# Patient Record
Sex: Female | Born: 1995 | Race: White | Hispanic: No | Smoking: Current every day smoker
Health system: Southern US, Community
[De-identification: ages and names within clinical notes are randomized; demographics above are authoritative.]

## PROBLEM LIST (undated history)

## (undated) ENCOUNTER — Inpatient Hospital Stay: Payer: Self-pay

## (undated) DIAGNOSIS — Z789 Other specified health status: Secondary | ICD-10-CM

## (undated) DIAGNOSIS — D649 Anemia, unspecified: Secondary | ICD-10-CM

## (undated) DIAGNOSIS — R519 Headache, unspecified: Secondary | ICD-10-CM

## (undated) HISTORY — DX: Headache, unspecified: R51.9

## (undated) HISTORY — PX: NO PAST SURGERIES: SHX2092

## (undated) HISTORY — DX: Other specified health status: Z78.9

## (undated) HISTORY — PX: OTHER SURGICAL HISTORY: SHX169

## (undated) HISTORY — DX: Anemia, unspecified: D64.9

---
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
## 2016-01-16 ENCOUNTER — Other Ambulatory Visit: Payer: Self-pay | Admitting: Physician Assistant

## 2016-01-16 DIAGNOSIS — Z369 Encounter for antenatal screening, unspecified: Secondary | ICD-10-CM

## 2016-01-16 LAB — OB RESULTS CONSOLE RPR: RPR: NONREACTIVE

## 2016-01-16 LAB — OB RESULTS CONSOLE HEPATITIS B SURFACE ANTIGEN: HEP B S AG: NEGATIVE

## 2016-01-16 LAB — OB RESULTS CONSOLE VARICELLA ZOSTER ANTIBODY, IGG: Varicella: NON-IMMUNE/NOT IMMUNE

## 2016-01-16 LAB — OB RESULTS CONSOLE RUBELLA ANTIBODY, IGM: Rubella: IMMUNE

## 2016-02-03 ENCOUNTER — Ambulatory Visit
Admission: RE | Admit: 2016-02-03 | Discharge: 2016-02-03 | Disposition: A | Discharge status: Home / Self Care | Payer: Medicaid Other | Source: Ambulatory Visit | Attending: Obstetrics and Gynecology | Admitting: Obstetrics and Gynecology

## 2016-02-03 ENCOUNTER — Ambulatory Visit (HOSPITAL_BASED_OUTPATIENT_CLINIC_OR_DEPARTMENT_OTHER)
Admission: RE | Admit: 2016-02-03 | Discharge: 2016-02-03 | Disposition: A | Discharge status: Home / Self Care | Payer: Medicaid Other | Source: Ambulatory Visit | Attending: Obstetrics and Gynecology | Admitting: Obstetrics and Gynecology

## 2016-02-03 ENCOUNTER — Encounter: Payer: Self-pay | Admitting: *Deleted

## 2016-02-03 VITALS — BP 109/64 | HR 92 | Temp 97.5°F | Resp 17 | Ht 60.0 in | Wt 90.0 lb

## 2016-02-03 DIAGNOSIS — Z369 Encounter for antenatal screening, unspecified: Secondary | ICD-10-CM | POA: Insufficient documentation

## 2016-02-03 DIAGNOSIS — Z363 Encounter for antenatal screening for malformations: Secondary | ICD-10-CM | POA: Insufficient documentation

## 2016-02-03 DIAGNOSIS — Z818 Family history of other mental and behavioral disorders: Secondary | ICD-10-CM | POA: Insufficient documentation

## 2016-02-03 NOTE — Progress Notes (Addendum)
Referring physician:  [facility] Length of Consultation: 40 minutes   Charlene Hood  [facility] for genetic counseling to review prenatal screening and testing options.  This note summarizes the information we discussed.    We offered the following routine screening tests for this pregnancy:  First trimester screening, which includes nuchal translucency ultrasound screen and first trimester maternal serum marker screening.  The nuchal translucency has approximately an 80% detection rate for Down syndrome and can be positive for other chromosome abnormalities as well as congenital heart defects.  When combined with a maternal serum marker screening, the detection rate is up to 90% for Down syndrome and up to 97% for trisomy 18.     Maternal serum marker screening, a blood test that measures pregnancy proteins, can provide risk assessments for Down syndrome, trisomy 18, and open neural tube defects (spina bifida, anencephaly). Because it does not directly examine the fetus, it cannot positively diagnose or rule out these problems.  Targeted ultrasound uses high frequency sound waves to create an image of the developing fetus.  An ultrasound is often recommended as a routine means of evaluating the pregnancy.  It is also used to screen for fetal anatomy problems (for example, a heart defect) that might be suggestive of a chromosomal or other abnormality.   Should these screening tests indicate an increased concern, then the following additional testing options would be offered:  The chorionic villus sampling procedure is available for first trimester chromosome analysis.  This involves the withdrawal of a small amount of chorionic villi (tissue from the developing placenta).  Risk of pregnancy loss is estimated to be approximately 1 in 200 to 1 in 100 (0.5 to 1%).  There is approximately a 1% (1 in 100) chance that the CVS chromosome results will  be unclear.  Chorionic villi cannot be tested for neural tube defects.     Amniocentesis involves the removal of a small amount of amniotic fluid from the sac surrounding the fetus with the use of a thin needle inserted through the maternal abdomen and uterus.  Ultrasound guidance is used throughout the procedure.  Fetal cells from amniotic fluid are directly evaluated and > 99.5% of chromosome problems and > 98% of open neural tube defects can be detected. This procedure is generally performed after the 15th week of pregnancy.  The main risks to this procedure include complications leading to miscarriage in less than 1 in 200 cases (0.5%).  As another option for information if the pregnancy is suspected to be an an increased chance for certain chromosome conditions, we also reviewed the availability of cell free fetal DNA testing from maternal blood to determine whether or not the baby may have either Down syndrome, trisomy 813, or trisomy 7718.  This test utilizes a maternal blood sample and DNA sequencing technology to isolate circulating cell free fetal DNA from maternal plasma.  The fetal DNA can then be analyzed for DNA sequences that are derived from the three most common chromosomes involved in aneuploidy, chromosomes 13, 18, and 21.  If the overall amount of DNA is greater than the expected level for any of these chromosomes, aneuploidy is suspected.  While we do not consider it a replacement for invasive testing and karyotype analysis, a negative result from this testing would be reassuring, though not a guarantee of a normal chromosome complement for the baby.  An abnormal result is certainly suggestive of an abnormal chromosome complement,  though we would still recommend CVS or amniocentesis to confirm any findings from this testing.  Cystic Fibrosis and Spinal Muscular Atrophy (SMA) screening were also discussed with the patient. Both conditions are recessive, which means that both parents must be  carriers in order to have a child with the disease.  Cystic fibrosis (CF) is one of the most common genetic conditions in persons of Caucasian ancestry.  This condition occurs in approximately 1 in 2,500 Caucasian persons and results in thickened secretions in the lungs, digestive, and reproductive systems.  For a baby to be at risk for having CF, both of the parents must be carriers for this condition.  Approximately 1 in 4625 Caucasian persons is a carrier for CF.  Current carrier testing looks for the most common mutations in the gene for CF and can detect approximately 90% of carriers in the Caucasian population.  This means that the carrier screening can greatly reduce, but cannot eliminate, the chance for an individual to have a child with CF.  If an individual is found to be a carrier for CF, then carrier testing would be available for the partner. As part of Charlene Hood's newborn screening profile, all babies born in the state of [place] will have a two-tier screening process.  Specimens are first tested to determine the concentration of immunoreactive trypsinogen (IRT).  The top 5% of specimens with the highest IRT values then undergo DNA testing using a panel of over 40 common CF mutations. SMA is a neurodegenerative disorder that leads to atrophy of skeletal muscle and overall weakness.  This condition is also more prevalent in the Caucasian population, with 1 in 40-1 in 60 persons being a carrier and 1 in 6,000-1 in 10,000 children being affected.  There are multiple forms of the disease, with some causing death in infancy to other forms with survival into adulthood.  The genetics of SMA is complex, but carrier screening can detect up to 95% of carriers in the Caucasian population.  Similar to CF, a negative result can greatly reduce, but cannot eliminate, the chance to have a child with SMA.  We obtained a detailed family history and pregnancy history.  Ms. Charlene Hood reported one maternal first  cousin with autism.  The family feels that this may have been related to substance abuse in pregnancy, but the patient is not aware of a specific diagnosis or if any genetic testing has been performed.  While autism may be a feature of certain genetic conditions, including Fragile X syndrome, there are also many cases of autism that are not well understood.  We are happy to review medical records on his testing to help provide a more accurate risk assessment if desired.  We offered Fragile X carrier testing for our patient today.  She also described one maternal aunt with brain damage at birth, who has two sons with mild learning differences.  Because there are so many reasons for developmental differences, more specific information is needed to accurately assess recurrence risks for other family members.  Lastly, the patient has a maternal first cousin who has two children with sickle cell trait.  The patient and her family are Caucasian, but the father of the children with sickle cell trait is African American.  We reviewed that the most likely scenario is that those children inherited that genetic change from their father.  However, it is possible that the sickle cell trait came from our patient's family.  For this reason, we offered hemoglobinopathy  testing for the patient.  We also discussed that all newborn babies in [place] are testing for hemoglobinopathies at birth.  The remainder of the family history was reported to be unremarkable for birth defects, mental retardation, recurrent pregnancy loss or known chromosome abnormalities.  Charlene Hood stated that this is her first pregnancy.  She reported no complications or exposures other than cigarette smoking during this pregnancy.  She reported smoking 3-4 cigarettes per day, down from 8-9 per day.  As we discussed, smoking during pregnancy has been associated with low birth weight, premature delivery and pregnancy loss.  For this reason, we suggest that she  continue to cut back or avoid smoking for the remainder of the pregnancy.  After consideration of the options, Charlene Hood elected to proceed with first trimester screening.  She declined carrier screening for CF, SMA, Fragile X and hemoglobinopathies.  Though she wanted first trimester screening, she left without having her blood drawn for this testing.  We spoke with her by phone and explained that if she wanted the screening, she had to return to our clinic today for labs (this testing cannot be performed after 13 weeks, 6 days).  She declined the option of returning today.  She is aware that maternal serum quad screening can be performed at [facility] in the second trimester if she desires risk assessment for Down syndrome, trisomy 73 and spina bifida.  An ultrasound was performed at the time of the visit.  The gestational age was consistent with 13 weeks, 5 days.  Fetal anatomy could not be assessed due to early gestational age.  Please refer to the ultrasound report for details of that study.  Charlene Hood was encouraged to call with questions or concerns.  We can be contacted at [phone].    Cherly Anderson, MS, CGC

## 2016-02-03 NOTE — Progress Notes (Signed)
Charlene Wells, MS, CGC performed an integral service incident to the physician's initial service.  I was physically present in the clinical area and was immediately available to render assistance.   [name]  

## 2016-02-17 NOTE — L&D Delivery Note (Signed)
       Delivery Note   Charlene Hood is a 21 y.o. G1P1001 at 6039w4d Estimated Date of Delivery: 08/05/16  PRE-OPERATIVE DIAGNOSIS:  1) 1739w4d pregnancy.   POST-OPERATIVE DIAGNOSIS:  1) 6839w4d pregnancy s/p Vaginal, Spontaneous Delivery   Delivery Type: Vaginal, Spontaneous Delivery    Delivery Clinician: Linzie CollinEVANS, Kalin Amrhein JAMES   Delivery Anesthesia: Epidural   ESTIMATED BLOOD LOSS: 150  ml    FINDINGS:   1) female infant, Apgar scores of 6    at 1 minute and 9    at 5 minutes and a birthweight of 115.7  ounces.    2) Nuchal cord: yes  SPECIMENS:   PLACENTA:   Appearance: Intact    Removal: Spontaneous      Disposition:     DISPOSITION:  Infant to left in stable condition in the delivery room, with L&D personnel and mother,  NARRATIVE SUMMARY: Labor course:  Ms. Charlene Hood is a G1P1001 at 9639w4d who presented for labor management.  She progressed well in labor without pitocin.  She received the appropriate anesthesia and proceeded to complete dilation. She evidenced good maternal expulsive effort during the second stage. She went on to deliver a viable infant. The placenta delivered without problems and was noted to be complete. A perineal and vaginal examination was performed. Episiotomy/Lacerations: 1st degree;Labial  Episiotomy or lacerations were repaired with Vicryl suture. The patient tolerated this well.  Elonda Huskyavid J. Anel Purohit, M.D. 07/26/2016 11:23 PM

## 2016-02-18 ENCOUNTER — Emergency Department
Admission: EM | Admit: 2016-02-18 | Discharge: 2016-02-18 | Disposition: A | Discharge status: Home / Self Care | Payer: Medicaid Other | Attending: Emergency Medicine | Admitting: Emergency Medicine

## 2016-02-18 ENCOUNTER — Encounter: Payer: Self-pay | Admitting: Emergency Medicine

## 2016-02-18 DIAGNOSIS — F17210 Nicotine dependence, cigarettes, uncomplicated: Secondary | ICD-10-CM | POA: Insufficient documentation

## 2016-02-18 DIAGNOSIS — Z3A16 16 weeks gestation of pregnancy: Secondary | ICD-10-CM | POA: Insufficient documentation

## 2016-02-18 DIAGNOSIS — O219 Vomiting of pregnancy, unspecified: Secondary | ICD-10-CM | POA: Diagnosis present

## 2016-02-18 DIAGNOSIS — R112 Nausea with vomiting, unspecified: Secondary | ICD-10-CM

## 2016-02-18 DIAGNOSIS — O99332 Smoking (tobacco) complicating pregnancy, second trimester: Secondary | ICD-10-CM | POA: Diagnosis not present

## 2016-02-18 LAB — URINALYSIS, ROUTINE W REFLEX MICROSCOPIC
Bacteria, UA: NONE SEEN
Bilirubin Urine: NEGATIVE
GLUCOSE, UA: NEGATIVE mg/dL
Hgb urine dipstick: NEGATIVE
KETONES UR: NEGATIVE mg/dL
NITRITE: NEGATIVE
PROTEIN: NEGATIVE mg/dL
Specific Gravity, Urine: 1.019 (ref 1.005–1.030)
pH: 6 (ref 5.0–8.0)

## 2016-02-18 LAB — COMPREHENSIVE METABOLIC PANEL
ALBUMIN: 3.8 g/dL (ref 3.5–5.0)
ALT: 17 U/L (ref 14–54)
AST: 30 U/L (ref 15–41)
Alkaline Phosphatase: 50 U/L (ref 38–126)
Anion gap: 7 (ref 5–15)
BUN: 13 mg/dL (ref 6–20)
CHLORIDE: 106 mmol/L (ref 101–111)
CO2: 25 mmol/L (ref 22–32)
CREATININE: 0.58 mg/dL (ref 0.44–1.00)
Calcium: 8.8 mg/dL — ABNORMAL LOW (ref 8.9–10.3)
GFR calc non Af Amer: >60 mL/min (ref >60)
GLUCOSE: 120 mg/dL — AB (ref 65–99)
Potassium: 3.6 mmol/L (ref 3.5–5.1)
SODIUM: 138 mmol/L (ref 135–145)
Total Bilirubin: 0.9 mg/dL (ref 0.3–1.2)
Total Protein: 7 g/dL (ref 6.5–8.1)

## 2016-02-18 LAB — CBC
HCT: 35 % (ref 35.0–47.0)
Hemoglobin: 12.1 g/dL (ref 12.0–16.0)
MCH: 31.5 pg (ref 26.0–34.0)
MCHC: 34.6 g/dL (ref 32.0–36.0)
MCV: 91.2 fL (ref 80.0–100.0)
PLATELETS: 245 10*3/uL (ref 150–440)
RBC: 3.83 MIL/uL (ref 3.80–5.20)
RDW: 13 % (ref 11.5–14.5)
WBC: 17 10*3/uL — AB (ref 3.6–11.0)

## 2016-02-18 LAB — LIPASE, BLOOD: Lipase: 24 U/L (ref 11–51)

## 2016-02-18 MED ORDER — ONDANSETRON HCL 4 MG PO TABS: 4.0000 mg | ORAL_TABLET | Freq: Every day | ORAL | 0 refills | Status: DC | PRN

## 2016-02-18 MED ORDER — ONDANSETRON HCL 4 MG/2ML IJ SOLN
INTRAMUSCULAR | Status: AC
  Administered 2016-02-18: 4 mg via INTRAVENOUS
  Filled 2016-02-18: qty 2

## 2016-02-18 MED ORDER — ONDANSETRON HCL 4 MG/2ML IJ SOLN
4.0000 mg | Freq: Once | INTRAMUSCULAR | Status: AC
  Administered 2016-02-18: 4 mg via INTRAVENOUS

## 2016-02-18 MED ORDER — SODIUM CHLORIDE 0.9 % IV BOLUS (SEPSIS)
1000.0000 mL | Freq: Once | INTRAVENOUS | Status: AC
  Administered 2016-02-18: 1000 mL via INTRAVENOUS

## 2016-02-18 NOTE — ED Triage Notes (Signed)
Pt c/o nausea and vomiting that started this am, denies fevers. Pt A&Ox4, NAD

## 2016-02-18 NOTE — ED Provider Notes (Signed)
[facility] Emergency Department Provider Note  Time seen: 5:42 PM  I have reviewed the triage vital signs and the nursing notes.   HISTORY  Chief Complaint Abdominal Pain    HPI Charlene Hood is a 21 y.o. female Approximately [redacted] weeks pregnant who presents to the emergency department with nausea and vomiting. According to the patient since this morning she has been very nauseated with vomiting. States she has had one loose bowel movement. States mild upper abdominal discomfort. Denies any lower abdominal discomfort. Denies any vaginal bleeding or discharge denies any dysuria or hematuria.  History reviewed. No pertinent past medical history.  Patient Active Problem List   Diagnosis Date Noted  . Family history of autism   . First trimester screening     History reviewed. No pertinent surgical history.  Prior to Admission medications   Medication Sig Start Date End Date Taking? Authorizing Provider  Prenatal Vit-Fe Fumarate-FA (MULTIVITAMIN-PRENATAL) 27-0.8 MG TABS tablet Take 1 tablet by mouth daily at 12 noon.    Historical Provider, MD    No Known Allergies  No family history on file.  Social History Social History  Substance Use Topics  . Smoking status: Current Every Day Smoker  . Smokeless tobacco: Never Used     Comment: 3-4 cigarettes per day  . Alcohol use No    Review of Systems Constitutional: Negative for fever. Cardiovascular: Negative for chest pain. Respiratory: Negative for shortness of breath. Gastrointestinal: mild upper abdominal discomfort. Positive for nausea and vomiting. One episode of diarrhea. Genitourinary: Negative for dysuria. negative for vaginal bleeding or discharge. Neurological: Negative for headache 10-point ROS otherwise negative.  ____________________________________________   PHYSICAL EXAM:  VITAL SIGNS: ED Triage Vitals  Enc Vitals Group     BP 02/18/16 1454 (!) 121/59     Pulse Rate 02/18/16  1454 (!) 103     Resp 02/18/16 1454 18     Temp 02/18/16 1454 98.2 F (36.8 C)     Temp Source 02/18/16 1454 Oral     SpO2 02/18/16 1454 98 %     Weight 02/18/16 1454 91 lb (41.3 kg)     Height 02/18/16 1454 5' (1.524 m)     Head Circumference --      Peak Flow --      Pain Score 02/18/16 1502 5     Pain Loc --      Pain Edu? --      Excl. in GC? --     Constitutional: Alert and oriented. Well appearing and in no distress. Eyes: Normal exam ENT   Head: Normocephalic and atraumatic.   Mouth/Throat: Mucous membranes are moist. Cardiovascular: Normal rate, regular rhythm. No murmur Respiratory: Normal respiratory effort without tachypnea nor retractions. Breath sounds are clear Gastrointestinal: Soft and nontender. No distention.  no abdominal tenderness on exam. Musculoskeletal: Nontender with normal range of motion in all extremities.  Neurologic:  Normal speech and language. No gross focal neurologic deficits  Skin:  Skin is warm, dry and intact.  Psychiatric: Mood and affect are normal.   ____________________________________________    INITIAL IMPRESSION / ASSESSMENT AND PLAN / ED COURSE  Pertinent labs & imaging results that were available during my care of the patient were reviewed by me and considered in my medical decision making (see chart for details).  patient presents to the emergency department with upper abdominal discomfort and nausea and vomiting beginning this morning. No abdominal tenderness. Patient is [redacted] weeks pregnant, followed by  the health Department.  the patient had an ultrasound performed in December. We will check labs, treat nausea, IV hydrate and closely monitor in the emergency department. Patient denies any history of morning sickness throughout this pregnancy thus far.  patient's labs show white blood cell count of 17,000, chemistry is largely within normal limits. Highly suspect likely gastroenteritis. We will continue to IV hydrate and  monitor closely. Overall the patient appears well, nontoxic, no distress.  Patient states he feels much better. Has tolerated date and drink without issue. We will discharge home with a course of Zofran. I discussed cleft lip and cleft palate risk with the patient. She will follow up with OB/GYN. ____________________________________________   FINAL CLINICAL IMPRESSION(S) / ED DIAGNOSES  vomiting    Minna AntisKevin Kayzlee Wirtanen, MD 02/18/16 1845

## 2016-02-18 NOTE — ED Notes (Signed)
Pt given dinner tray.  Pt ate and tolerated food well.

## 2016-03-09 ENCOUNTER — Ambulatory Visit
Admission: RE | Admit: 2016-03-09 | Discharge: 2016-03-09 | Disposition: A | Discharge status: Home / Self Care | Payer: Medicaid Other | Source: Ambulatory Visit | Attending: Obstetrics and Gynecology | Admitting: Obstetrics and Gynecology

## 2016-03-09 ENCOUNTER — Other Ambulatory Visit: Payer: Self-pay | Admitting: *Deleted

## 2016-03-09 DIAGNOSIS — Z362 Encounter for other antenatal screening follow-up: Secondary | ICD-10-CM | POA: Insufficient documentation

## 2016-03-09 DIAGNOSIS — Z3A18 18 weeks gestation of pregnancy: Secondary | ICD-10-CM | POA: Insufficient documentation

## 2016-03-09 DIAGNOSIS — Z0489 Encounter for examination and observation for other specified reasons: Secondary | ICD-10-CM

## 2016-03-09 DIAGNOSIS — IMO0002 Reserved for concepts with insufficient information to code with codable children: Secondary | ICD-10-CM

## 2016-05-07 LAB — OB RESULTS CONSOLE HIV ANTIBODY (ROUTINE TESTING): HIV: NONREACTIVE

## 2016-05-08 LAB — OB RESULTS CONSOLE RPR: RPR: NONREACTIVE

## 2016-07-03 ENCOUNTER — Observation Stay
Admission: EM | Admit: 2016-07-03 | Discharge: 2016-07-03 | Disposition: A | Discharge status: Home / Self Care | Payer: Medicaid Other | Attending: Obstetrics and Gynecology | Admitting: Obstetrics and Gynecology

## 2016-07-03 ENCOUNTER — Encounter: Payer: Self-pay | Admitting: *Deleted

## 2016-07-03 DIAGNOSIS — R102 Pelvic and perineal pain: Secondary | ICD-10-CM | POA: Insufficient documentation

## 2016-07-03 DIAGNOSIS — O26893 Other specified pregnancy related conditions, third trimester: Principal | ICD-10-CM | POA: Insufficient documentation

## 2016-07-03 DIAGNOSIS — Z3A35 35 weeks gestation of pregnancy: Secondary | ICD-10-CM | POA: Insufficient documentation

## 2016-07-03 DIAGNOSIS — R109 Unspecified abdominal pain: Secondary | ICD-10-CM | POA: Diagnosis not present

## 2016-07-03 DIAGNOSIS — Z3490 Encounter for supervision of normal pregnancy, unspecified, unspecified trimester: Secondary | ICD-10-CM

## 2016-07-03 NOTE — Discharge Instructions (Signed)
Stay well hydrated and get plenty of rest! ° °Come back if: ° °Big gush of fluid °Decreased fetal movement °Heavy vaginal bleeding °Temp over 100.4 °Contractions every 3-5 min lasting at least one hour °

## 2016-07-03 NOTE — Progress Notes (Signed)
Spoke with Dr. Logan BoresEvans. Explained she was having light menstrual cramps that she has not felt since being here. Was told to reassure pt and drink more water. May discharge pt.

## 2016-07-03 NOTE — OB Triage Note (Signed)
Recvd pt from ED. C/O abdominal pain that feels like light menstrual cramps and sharp vaginal pain that comes and goes. No intercourse in the past 24 hours and not a lot to drink today. No vaginal bleeding and feeling baby move ok. Rates pain a 7 our of 10.

## 2016-07-04 NOTE — Discharge Summary (Signed)
    L&D OB Triage Note  SUBJECTIVE Charlene Hood is a 21 y.o. G1P0 female at 6443w3d, EDD Estimated Date of Delivery: 08/05/16 who presented to triage with complaints of abdominal pain that feels like light menstrual cramps and sharp vaginal pain that comes and goes.  No vaginal bleeding.  Baby active.  Obstetric History   G1   P0   T0   P0   A0   L0    SAB0   TAB0   Ectopic0   Multiple0   Live Births0     # Outcome Date GA Lbr Len/2nd Weight Sex Delivery Anes PTL Lv  1 Current               No prescriptions prior to admission.     OBJECTIVE  Nursing Evaluation:   BP 125/75   Pulse 81   Temp 98.4 F (36.9 C) (Oral)   Resp 16   LMP 11/09/2015    Findings:  Rare mild contractions  NST was performed and has been reviewed by me.  NST INTERPRETATION: Category I  Mode: External Baseline Rate (A): 135 bpm Variability: Moderate Accelerations: 10 x 10, 15 x 15 Decelerations: None     Contraction Frequency (min): irritability with few contractions  ASSESSMENT Impression:  1. Pregnancy:  G1P0 at 6743w3d , EDD Estimated Date of Delivery: 08/05/16 2. Not in labor.   3. Fetal reassurance.  PLAN 1. Reassurance given 2. Discharge home with precautions to return to L&D  3. Continue routine prenatal care.

## 2016-07-11 LAB — OB RESULTS CONSOLE GBS: GBS: NEGATIVE

## 2016-07-11 LAB — OB RESULTS CONSOLE GC/CHLAMYDIA
CHLAMYDIA, DNA PROBE: NEGATIVE
GC PROBE AMP, GENITAL: NEGATIVE

## 2016-07-26 ENCOUNTER — Encounter: Payer: Self-pay | Admitting: *Deleted

## 2016-07-26 ENCOUNTER — Inpatient Hospital Stay: Payer: Medicaid Other | Admitting: Anesthesiology

## 2016-07-26 ENCOUNTER — Inpatient Hospital Stay
Admission: EM | Admit: 2016-07-26 | Discharge: 2016-07-28 | DRG: 775 | Disposition: A | Discharge status: Home / Self Care | Payer: Medicaid Other | Attending: Obstetrics and Gynecology | Admitting: Obstetrics and Gynecology

## 2016-07-26 DIAGNOSIS — Z3493 Encounter for supervision of normal pregnancy, unspecified, third trimester: Secondary | ICD-10-CM | POA: Diagnosis present

## 2016-07-26 DIAGNOSIS — Z3A38 38 weeks gestation of pregnancy: Secondary | ICD-10-CM

## 2016-07-26 LAB — CBC
HEMATOCRIT: 37.7 % (ref 35.0–47.0)
HEMOGLOBIN: 13.4 g/dL (ref 12.0–16.0)
MCH: 31.2 pg (ref 26.0–34.0)
MCHC: 35.6 g/dL (ref 32.0–36.0)
MCV: 87.7 fL (ref 80.0–100.0)
Platelets: 266 10*3/uL (ref 150–440)
RBC: 4.3 MIL/uL (ref 3.80–5.20)
RDW: 13.1 % (ref 11.5–14.5)
WBC: 19.2 10*3/uL — ABNORMAL HIGH (ref 3.6–11.0)

## 2016-07-26 LAB — TYPE AND SCREEN
ABO/RH(D): A POS
ANTIBODY SCREEN: NEGATIVE

## 2016-07-26 LAB — URINE DRUG SCREEN, QUALITATIVE (ARMC ONLY)
Amphetamines, Ur Screen: NOT DETECTED
BARBITURATES, UR SCREEN: NOT DETECTED
Benzodiazepine, Ur Scrn: NOT DETECTED
Cannabinoid 50 Ng, Ur ~~LOC~~: NOT DETECTED
Cocaine Metabolite,Ur ~~LOC~~: NOT DETECTED
MDMA (Ecstasy)Ur Screen: NOT DETECTED
METHADONE SCREEN, URINE: NOT DETECTED
Opiate, Ur Screen: NOT DETECTED
Phencyclidine (PCP) Ur S: NOT DETECTED
Tricyclic, Ur Screen: NOT DETECTED

## 2016-07-26 MED ORDER — MEPERIDINE HCL 25 MG/ML IJ SOLN: 6.2500 mg | INTRAMUSCULAR | Status: DC | PRN

## 2016-07-26 MED ORDER — FENTANYL 2.5 MCG/ML W/ROPIVACAINE 0.2% IN NS 100 ML EPIDURAL INFUSION (ARMC-ANES)
EPIDURAL | Status: DC | PRN
  Administered 2016-07-26: 10 mL/h via EPIDURAL

## 2016-07-26 MED ORDER — OXYTOCIN BOLUS FROM INFUSION
500.0000 mL | Freq: Once | INTRAVENOUS | Status: AC
  Administered 2016-07-26: 500 mL via INTRAVENOUS

## 2016-07-26 MED ORDER — FENTANYL 2.5 MCG/ML W/ROPIVACAINE 0.2% IN NS 100 ML EPIDURAL INFUSION (ARMC-ANES): 10.0000 mL/h | EPIDURAL | Status: DC

## 2016-07-26 MED ORDER — FENTANYL 2.5 MCG/ML W/ROPIVACAINE 0.2% IN NS 100 ML EPIDURAL INFUSION (ARMC-ANES)
EPIDURAL | Status: AC
  Filled 2016-07-26: qty 100

## 2016-07-26 MED ORDER — LIDOCAINE-EPINEPHRINE (PF) 1.5 %-1:200000 IJ SOLN
INTRAMUSCULAR | Status: DC | PRN
  Administered 2016-07-26: 3 mL via EPIDURAL

## 2016-07-26 MED ORDER — MISOPROSTOL 200 MCG PO TABS
ORAL_TABLET | ORAL | Status: AC
  Filled 2016-07-26: qty 4

## 2016-07-26 MED ORDER — ACETAMINOPHEN 325 MG PO TABS: 650.0000 mg | ORAL_TABLET | ORAL | Status: DC | PRN

## 2016-07-26 MED ORDER — KETOROLAC TROMETHAMINE 30 MG/ML IJ SOLN: 30.0000 mg | Freq: Four times a day (QID) | INTRAMUSCULAR | Status: AC | PRN

## 2016-07-26 MED ORDER — DIPHENHYDRAMINE HCL 50 MG/ML IJ SOLN: 12.5000 mg | INTRAMUSCULAR | Status: DC | PRN

## 2016-07-26 MED ORDER — NALBUPHINE HCL 10 MG/ML IJ SOLN
5.0000 mg | Freq: Once | INTRAMUSCULAR | Status: DC | PRN
Start: 2016-07-26 — End: 2016-07-29

## 2016-07-26 MED ORDER — ONDANSETRON HCL 4 MG/2ML IJ SOLN: 4.0000 mg | Freq: Three times a day (TID) | INTRAMUSCULAR | Status: DC | PRN

## 2016-07-26 MED ORDER — LIDOCAINE HCL (PF) 1 % IJ SOLN
INTRAMUSCULAR | Status: DC | PRN
  Administered 2016-07-26: 3 mL via SUBCUTANEOUS

## 2016-07-26 MED ORDER — NALOXONE HCL 0.4 MG/ML IJ SOLN: 0.4000 mg | INTRAMUSCULAR | Status: DC | PRN

## 2016-07-26 MED ORDER — NALBUPHINE HCL 10 MG/ML IJ SOLN: 5.0000 mg | INTRAMUSCULAR | Status: DC | PRN

## 2016-07-26 MED ORDER — SODIUM CHLORIDE 0.9% FLUSH: 3.0000 mL | INTRAVENOUS | Status: DC | PRN

## 2016-07-26 MED ORDER — AMMONIA AROMATIC IN INHA
RESPIRATORY_TRACT | Status: AC
  Filled 2016-07-26: qty 10

## 2016-07-26 MED ORDER — LACTATED RINGERS IV SOLN
INTRAVENOUS | Status: DC
  Administered 2016-07-26 (×2): via INTRAVENOUS

## 2016-07-26 MED ORDER — BUTORPHANOL TARTRATE 2 MG/ML IJ SOLN
INTRAMUSCULAR | Status: AC
  Administered 2016-07-26: 1 mg via INTRAVENOUS
  Filled 2016-07-26: qty 2

## 2016-07-26 MED ORDER — SOD CITRATE-CITRIC ACID 500-334 MG/5ML PO SOLN
30.0000 mL | ORAL | Status: DC | PRN
  Filled 2016-07-26: qty 30

## 2016-07-26 MED ORDER — BUTORPHANOL TARTRATE 1 MG/ML IJ SOLN
1.0000 mg | INTRAMUSCULAR | Status: DC | PRN
  Administered 2016-07-26: 1 mg via INTRAVENOUS

## 2016-07-26 MED ORDER — OXYTOCIN 10 UNIT/ML IJ SOLN
INTRAMUSCULAR | Status: AC
  Filled 2016-07-26: qty 2

## 2016-07-26 MED ORDER — SODIUM CHLORIDE 0.9 % IV SOLN
INTRAVENOUS | Status: DC | PRN
  Administered 2016-07-26 (×2): 4 mL via EPIDURAL

## 2016-07-26 MED ORDER — NALBUPHINE HCL 10 MG/ML IJ SOLN: 5.0000 mg | Freq: Once | INTRAMUSCULAR | Status: DC | PRN

## 2016-07-26 MED ORDER — LIDOCAINE HCL (PF) 1 % IJ SOLN
INTRAMUSCULAR | Status: AC
  Filled 2016-07-26: qty 30

## 2016-07-26 MED ORDER — OXYTOCIN 40 UNITS IN LACTATED RINGERS INFUSION - SIMPLE MED: 2.5000 [IU]/h | INTRAVENOUS | Status: DC

## 2016-07-26 MED ORDER — NALOXONE HCL 2 MG/2ML IJ SOSY
1.0000 ug/kg/h | PREFILLED_SYRINGE | INTRAVENOUS | Status: DC | PRN
  Filled 2016-07-26: qty 2

## 2016-07-26 MED ORDER — DIPHENHYDRAMINE HCL 25 MG PO CAPS: 25.0000 mg | ORAL_CAPSULE | ORAL | Status: DC | PRN

## 2016-07-26 MED ORDER — PNEUMOCOCCAL VAC POLYVALENT 25 MCG/0.5ML IJ INJ
0.5000 mL | INJECTION | INTRAMUSCULAR | Status: DC
  Filled 2016-07-26: qty 0.5

## 2016-07-26 MED ORDER — LIDOCAINE HCL (PF) 1 % IJ SOLN: 30.0000 mL | INTRAMUSCULAR | Status: DC | PRN

## 2016-07-26 MED ORDER — IBUPROFEN 600 MG PO TABS
600.0000 mg | ORAL_TABLET | Freq: Four times a day (QID) | ORAL | Status: DC
  Administered 2016-07-27 – 2016-07-28 (×7): 600 mg via ORAL
  Filled 2016-07-26 (×7): qty 1

## 2016-07-26 MED ORDER — OXYTOCIN 40 UNITS IN LACTATED RINGERS INFUSION - SIMPLE MED
INTRAVENOUS | Status: AC
  Administered 2016-07-26: 500 mL via INTRAVENOUS
  Filled 2016-07-26: qty 1000

## 2016-07-26 MED ORDER — LACTATED RINGERS IV SOLN
500.0000 mL | INTRAVENOUS | Status: DC | PRN
  Administered 2016-07-26: 1000 mL via INTRAVENOUS

## 2016-07-26 MED ORDER — ONDANSETRON HCL 4 MG/2ML IJ SOLN: 4.0000 mg | Freq: Four times a day (QID) | INTRAMUSCULAR | Status: DC | PRN

## 2016-07-26 NOTE — Anesthesia Preprocedure Evaluation (Signed)
Anesthesia Evaluation  Patient identified by MRN, date of birth, ID band Patient awake    Reviewed: Allergy & Precautions, H&P , NPO status , Patient's Chart, lab work & pertinent test results, reviewed documented beta blocker date and time   History of Anesthesia Complications Negative for: history of anesthetic complications  Airway Mallampati: III  TM Distance: >3 FB Neck ROM: full    Dental  (+) Caps, Teeth Intact   Pulmonary neg shortness of breath, Recent URI , Current Smoker,           Cardiovascular Exercise Tolerance: Good negative cardio ROS       Neuro/Psych negative neurological ROS  negative psych ROS   GI/Hepatic negative GI ROS, Neg liver ROS,   Endo/Other  negative endocrine ROS  Renal/GU negative Renal ROS  negative genitourinary   Musculoskeletal   Abdominal   Peds  Hematology negative hematology ROS (+)   Anesthesia Other Findings History reviewed. No pertinent past medical history.   Reproductive/Obstetrics (+) Pregnancy                             Anesthesia Physical Anesthesia Plan  ASA: II  Anesthesia Plan: Epidural   Post-op Pain Management:    Induction:   PONV Risk Score and Plan:   Airway Management Planned:   Additional Equipment:   Intra-op Plan:   Post-operative Plan:   Informed Consent: I have reviewed the patients History and Physical, chart, labs and discussed the procedure including the risks, benefits and alternatives for the proposed anesthesia with the patient or authorized representative who has indicated his/her understanding and acceptance.   Dental Advisory Given  Plan Discussed with: Anesthesiologist, CRNA and Surgeon  Anesthesia Plan Comments:         Anesthesia Quick Evaluation

## 2016-07-26 NOTE — H&P (Signed)
     History and Physical   HPI  Charlene Hood is a 21 y.o. G1P0 at 2778w4d Estimated Date of Delivery: 08/05/16 who is being admitted for  labor management [facility]  OB History  Obstetric History   G1   P0   T0   P0   A0   L0    SAB0   TAB0   Ectopic0   Multiple0   Live Births0     # Outcome Date GA Lbr Len/2nd Weight Sex Delivery Anes PTL Lv  1 Current               PROBLEM LIST  Pregnancy complications or risks: Patient Active Problem List   Diagnosis Date Noted  . Pregnancy 07/03/2016  . Family history of autism   . First trimester screening     Prenatal labs and studies: ABO, Rh:  A + Antibody:  N Rubella:   RPR:   N HBsAg:  N  HIV:  N  GBS: Negative  History reviewed. No pertinent past medical history.   Past Surgical History:  Procedure Laterality Date  . NO PAST SURGERIES       Medications    Current Discharge Medication List    CONTINUE these medications which have NOT CHANGED   Details  acetaminophen (TYLENOL) 500 MG tablet Take 500 mg by mouth every 6 (six) hours as needed.    ferrous sulfate 325 (65 FE) MG tablet Take 325 mg by mouth daily with breakfast.    Prenatal Vit-Fe Fumarate-FA (MULTIVITAMIN-PRENATAL) 27-0.8 MG TABS tablet Take 1 tablet by mouth daily at 12 noon.    ondansetron (ZOFRAN) 4 MG tablet Take 1 tablet (4 mg total) by mouth daily as needed for nausea or vomiting. Qty: 20 tablet, Refills: 0         Allergies  Patient has no known allergies.  Review of Systems  Pertinent items noted in HPI and remainder of comprehensive ROS otherwise negative.  Physical Exam  BP 129/87 (BP Location: Left Arm)   Pulse 74   Temp 98.3 F (36.8 C) (Oral)   Resp 16   Ht 5' (1.524 m)   Wt 108 lb (49 kg)   LMP 11/09/2015   BMI 21.09 kg/m   Lungs:  CTA B Cardio: RRR without M/R/G Abd: Soft, gravid, NT Presentation: cephalic EXT: No C/C/ 1+ Edema DTRs: 2+ B CERVIX: 3 cm:75%: -3: mid position:  soft  See Prenatal records for more detailed PE.     FHR:  Variability: Good {> 6 bpm)  Toco: Uterine Contractions: Frequency: Every 3 minutes and Intensity: moderate   Test Results  No results found for this or any previous visit (from the past 24 hour(s)). Group B Strep negative  Assessment   G1P0 at 178w4d Estimated Date of Delivery: 08/05/16  The fetus is reassuring.   Patient Active Problem List   Diagnosis Date Noted  . Pregnancy 07/03/2016  . Family history of autism   . First trimester screening     Plan  1. Admit to L&D for expectant management 2. EFM: -- Category 1 3. Epidural if desired. Stadol for IV pain until epidural requested. 4. Admission labs    Elonda Huskyavid J. Fannie Alomar, M.D. 07/26/2016 12:26 PM

## 2016-07-26 NOTE — OB Triage Note (Signed)
Ctx started @ 0500 this am worsening @ 0900. Cervix 1 cm as of Friday, per pt. Elaina HoopsElks, Acel Natzke S

## 2016-07-26 NOTE — Anesthesia Procedure Notes (Signed)
Epidural Patient location during procedure: OB Start time: 07/26/2016 2:03 PM End time: 07/26/2016 2:12 PM  Staffing Anesthesiologist: Lenard SimmerKARENZ, Irena Gaydos Performed: anesthesiologist   Preanesthetic Checklist Completed: patient identified, site marked, surgical consent, pre-op evaluation, timeout performed, IV checked, risks and benefits discussed and monitors and equipment checked  Epidural Patient position: sitting Prep: ChloraPrep Patient monitoring: heart rate, continuous pulse ox and blood pressure Approach: midline Location: L3-L4 Injection technique: LOR saline  Needle:  Needle type: Tuohy  Needle gauge: 17 G Needle length: 9 cm and 9 Needle insertion depth: 3.5 cm Catheter type: closed end flexible Catheter size: 19 Gauge Catheter at skin depth: 8 cm Test dose: negative and 1.5% lidocaine with Epi 1:200 K  Assessment Sensory level: T10 Events: blood not aspirated, injection not painful, no injection resistance, negative IV test and no paresthesia  Additional Notes Pt. Evaluated and documentation done after procedure finished. Patient identified. Risks/Benefits/Options discussed with patient including but not limited to bleeding, infection, nerve damage, paralysis, failed block, incomplete pain control, headache, blood pressure changes, nausea, vomiting, reactions to medication both or allergic, itching and postpartum back pain. Confirmed with bedside nurse the patient's most recent platelet count. Confirmed with patient that they are not currently taking any anticoagulation, have any bleeding history or any family history of bleeding disorders. Patient expressed understanding and wished to proceed. All questions were answered. Sterile technique was used throughout the entire procedure. Please see nursing notes for vital signs. Test dose was given through epidural catheter and negative prior to continuing to dose epidural or start infusion. Warning signs of high block given to the  patient including shortness of breath, tingling/numbness in hands, complete motor block, or any concerning symptoms with instructions to call for help. Patient was given instructions on fall risk and not to get out of bed. All questions and concerns addressed with instructions to call with any issues or inadequate analgesia.   Patient tolerated the insertion well without immediate complications.Reason for block:procedure for pain

## 2016-07-27 DIAGNOSIS — Z3A38 38 weeks gestation of pregnancy: Secondary | ICD-10-CM

## 2016-07-27 LAB — CHLAMYDIA/NGC RT PCR (ARMC ONLY)
Chlamydia Tr: NOT DETECTED
N GONORRHOEAE: NOT DETECTED

## 2016-07-27 LAB — RPR: RPR Ser Ql: NONREACTIVE

## 2016-07-27 MED ORDER — BENZOCAINE-MENTHOL 20-0.5 % EX AERO
1.0000 "application " | INHALATION_SPRAY | CUTANEOUS | Status: DC | PRN
  Filled 2016-07-27: qty 56

## 2016-07-27 MED ORDER — DIPHENHYDRAMINE HCL 25 MG PO CAPS: 25.0000 mg | ORAL_CAPSULE | Freq: Four times a day (QID) | ORAL | Status: DC | PRN

## 2016-07-27 MED ORDER — ACETAMINOPHEN 325 MG PO TABS: 650.0000 mg | ORAL_TABLET | ORAL | Status: DC | PRN

## 2016-07-27 MED ORDER — SIMETHICONE 80 MG PO CHEW: 80.0000 mg | CHEWABLE_TABLET | ORAL | Status: DC | PRN

## 2016-07-27 MED ORDER — TETANUS-DIPHTH-ACELL PERTUSSIS 5-2.5-18.5 LF-MCG/0.5 IM SUSP: 0.5000 mL | Freq: Once | INTRAMUSCULAR | Status: DC

## 2016-07-27 MED ORDER — DOCUSATE SODIUM 100 MG PO CAPS
100.0000 mg | ORAL_CAPSULE | Freq: Two times a day (BID) | ORAL | Status: DC
  Administered 2016-07-27 – 2016-07-28 (×3): 100 mg via ORAL
  Filled 2016-07-27 (×3): qty 1

## 2016-07-27 MED ORDER — ONDANSETRON HCL 4 MG/2ML IJ SOLN: 4.0000 mg | INTRAMUSCULAR | Status: DC | PRN

## 2016-07-27 MED ORDER — ONDANSETRON HCL 4 MG PO TABS: 4.0000 mg | ORAL_TABLET | ORAL | Status: DC | PRN

## 2016-07-27 MED ORDER — OXYCODONE-ACETAMINOPHEN 5-325 MG PO TABS: 2.0000 | ORAL_TABLET | ORAL | Status: DC | PRN

## 2016-07-27 MED ORDER — PRENATAL MULTIVITAMIN CH
1.0000 | ORAL_TABLET | Freq: Every day | ORAL | Status: DC
  Administered 2016-07-27 – 2016-07-28 (×2): 1 via ORAL
  Filled 2016-07-27 (×2): qty 1

## 2016-07-27 MED ORDER — OXYCODONE-ACETAMINOPHEN 5-325 MG PO TABS
1.0000 | ORAL_TABLET | ORAL | Status: DC | PRN
  Administered 2016-07-27 – 2016-07-28 (×2): 1 via ORAL
  Filled 2016-07-27 (×2): qty 1

## 2016-07-27 MED ORDER — DIBUCAINE 1 % RE OINT: 1.0000 "application " | TOPICAL_OINTMENT | RECTAL | Status: DC | PRN

## 2016-07-27 MED ORDER — OXYTOCIN 40 UNITS IN LACTATED RINGERS INFUSION - SIMPLE MED
2.5000 [IU]/h | INTRAVENOUS | Status: DC | PRN
  Filled 2016-07-27: qty 1000

## 2016-07-27 MED ORDER — COCONUT OIL OIL
1.0000 "application " | TOPICAL_OIL | Status: DC | PRN
  Administered 2016-07-27: 1 via TOPICAL
  Filled 2016-07-27: qty 120

## 2016-07-27 MED ORDER — ZOLPIDEM TARTRATE 5 MG PO TABS: 5.0000 mg | ORAL_TABLET | Freq: Every evening | ORAL | Status: DC | PRN

## 2016-07-27 MED ORDER — WITCH HAZEL-GLYCERIN EX PADS: 1.0000 "application " | MEDICATED_PAD | CUTANEOUS | Status: DC | PRN

## 2016-07-27 NOTE — Anesthesia Postprocedure Evaluation (Signed)
Anesthesia Post Note  Patient: Charlene Hood  Procedure(s) Performed: * No procedures listed *  Patient location during evaluation: Mother Baby Anesthesia Type: Epidural Level of consciousness: awake, awake and alert and oriented Pain management: pain level controlled Vital Signs Assessment: post-procedure vital signs reviewed and stable Respiratory status: spontaneous breathing Cardiovascular status: blood pressure returned to baseline Postop Assessment: no headache, no backache, no signs of nausea or vomiting and adequate PO intake Anesthetic complications: no     Last Vitals:  Vitals:   07/27/16 0429 07/27/16 0440  BP: 120/66   Pulse: 64   Resp: 13 18  Temp: 37.1 C     Last Pain:  Vitals:   07/27/16 0429  TempSrc: Oral  PainSc:                  Karoline Caldwelleana Adaira Centola

## 2016-07-27 NOTE — Progress Notes (Signed)
Patient ID: Charlene Hood, female   DOB: 1995/04/26, 21 y.o.   MRN: [id number]  Progress Note - Vaginal Delivery  Charlene Bladeicole D Mcevoy is a 21 y.o. G1P1001 now PP day 1 s/p Vaginal, Spontaneous Delivery .   Subjective:  The patient reports no complaints  Objective:  Vital signs in last 24 hours: Temp:  [98.1 F (36.7 C)-99.8 F (37.7 C)] 98.1 F (36.7 C) (06/11 1059) Pulse Rate:  [61-104] 61 (06/11 1059) Resp:  [13-20] 18 (06/11 1059) BP: (110-149)/(61-95) 117/61 (06/11 1059) SpO2:  [97 %-100 %] 97 % (06/11 1059)  Physical Exam:  General: alert, cooperative and no distress Lochia: appropriate Uterine Fundus: firm DVT Evaluation: No evidence of DVT seen on physical exam.    Data Review  Recent Labs  07/26/16 1229  HGB 13.4  HCT 37.7    Assessment/Plan: Active Problems:   Normal labor   Plan for discharge tomorrow and Breastfeeding without problem  -- Continue routine PP care.     Elonda Huskyavid J. Evans, M.D. 07/27/2016 3:23 PM

## 2016-07-28 MED ORDER — VARICELLA VIRUS VACCINE LIVE 1350 PFU/0.5ML ~~LOC~~ INJ
0.5000 mL | INJECTION | Freq: Once | SUBCUTANEOUS | Status: AC
Start: 2016-07-28 — End: 2016-07-28
  Administered 2016-07-28: 0.5 mL via SUBCUTANEOUS
  Filled 2016-07-28: qty 0.5
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
## 2016-07-28 NOTE — Discharge Instructions (Signed)
Discharged home with infant

## 2016-07-28 NOTE — Discharge Summary (Signed)
                              Discharge Summary  Date of Admission: 07/26/2016  Date of Discharge: 07/28/2016  Admitting Diagnosis: Onset of Labor at 5776w4d  Secondary Diagnosis:   Mode of Delivery: normal spontaneous vaginal delivery     Discharge Diagnosis: Normal PP   Intrapartum Procedures: Atificial rupture of membranes, epidural and placement of intrauterine catheter   Post partum procedures:   Complications: none                      Discharge Day SOAP Note:  Progress Note - Vaginal Delivery  Charlene Hood is a 21 y.o. G1P1001 now PP day 2 s/p Vaginal, Spontaneous Delivery . Delivery was uncomplicated  Subjective  The patient has the following complaints: has no unusual complaints  Pain is controlled with current medications.   Patient is urinating without difficulty.  She is ambulating well.    Objective  Vital signs: BP 124/84 (BP Location: Right Arm)   Pulse 81   Temp 98 F (36.7 C) (Oral)   Resp 16   Ht 5' (1.524 m)   Wt 108 lb (49 kg)   LMP 11/09/2015   SpO2 98%   Breastfeeding? Unknown   BMI 21.09 kg/m   Physical Exam: Gen: NAD Fundus Fundal Tone: Firm  Lochia Amount: Small  Perineum Appearance: Edematous, Approximated     Data Review Labs: CBC Latest Ref Rng & Units 07/26/2016 02/18/2016  WBC 3.6 - 11.0 K/uL 19.2(H) 17.0(H)  Hemoglobin 12.0 - 16.0 g/dL 16.113.4 09.612.1  Hematocrit 04.535.0 - 47.0 % 37.7 35.0  Platelets 150 - 440 K/uL 266 245   A POS  Assessment/Plan  Active Problems:   Normal labor    Plan for discharge today.   Discharge Instructions: Per After Visit Summary. Activity: Advance as tolerated. Pelvic rest for 6 weeks.  Also refer to After Visit Summary Diet: Regular Medications: Allergies as of 07/28/2016   No Known Allergies     Medication List    STOP taking these medications   ferrous sulfate 325 (65 FE) MG tablet   multivitamin-prenatal 27-0.8 MG Tabs tablet   ondansetron 4 MG tablet Commonly known as:   ZOFRAN     TAKE these medications   acetaminophen 500 MG tablet Commonly known as:  TYLENOL Take 500 mg by mouth every 6 (six) hours as needed.      Outpatient follow up:  Follow-up Information    Linzie CollinEvans, Marinell Igarashi James, MD Follow up in 6 week(s).   Specialty:  Obstetrics and Gynecology Contact information: [address] [place] [address] [phone]          Postpartum contraception: discuss at 6 wk check  Discharged Condition: good  Discharged to: home  Newborn Data: Disposition:baby under bili lights today - possible d/c at 1600  Apgars: APGAR (1 MIN): 6   APGAR (5 MINS): 9   APGAR (10 MINS):    Baby Feeding: Breast    Elonda Huskyavid J. Hiawatha Merriott, M.D. 07/28/2016 8:25 AM

## 2016-07-28 NOTE — Progress Notes (Signed)
Discharged home with baby. Infant placed in carseat by parent

## 2016-07-28 NOTE — Care Management Note (Signed)
Case Management Note  Patient Details  Name: Charlene Hood MRN: [id number] Date of Birth: 02/21/1995  Subjective/Objective:     Wants more information about medicaid. Mother has maternity medicaid               Action/Plan: Discussed going or calling the Department of Social Services to start the application process for new baby Greig Castillandrew.   Expected Discharge Date:  07/28/16               Expected Discharge Plan:     In-House Referral:   yes  Discharge planning Services   yes  Post Acute Care Choice:   no Choice offered to:     DME Arranged:   no DME Agency:     HH Arranged:   no HH Agency:     Status of Service:   in progress  If discussed at Long Length of Stay Meetings, dates discussed:    Additional Comments:  Gwenette GreetBrenda S Ajahni Nay, RN MSN CCM Care Management [phone] 07/28/2016, 9:39 AM

## 2017-10-29 DIAGNOSIS — Z30013 Encounter for initial prescription of injectable contraceptive: Secondary | ICD-10-CM | POA: Diagnosis not present

## 2017-10-29 DIAGNOSIS — Z0000 Encounter for general adult medical examination without abnormal findings: Secondary | ICD-10-CM | POA: Diagnosis not present

## 2017-10-29 DIAGNOSIS — Z3009 Encounter for other general counseling and advice on contraception: Secondary | ICD-10-CM | POA: Diagnosis not present

## 2018-02-25 DIAGNOSIS — Z30013 Encounter for initial prescription of injectable contraceptive: Secondary | ICD-10-CM | POA: Diagnosis not present

## 2018-02-25 DIAGNOSIS — Z309 Encounter for contraceptive management, unspecified: Secondary | ICD-10-CM | POA: Diagnosis not present

## 2019-04-24 DIAGNOSIS — H5203 Hypermetropia, bilateral: Secondary | ICD-10-CM | POA: Diagnosis not present

## 2019-04-24 DIAGNOSIS — H52223 Regular astigmatism, bilateral: Secondary | ICD-10-CM | POA: Diagnosis not present

## 2019-04-24 DIAGNOSIS — H53023 Refractive amblyopia, bilateral: Secondary | ICD-10-CM | POA: Diagnosis not present

## 2019-04-25 DIAGNOSIS — H5213 Myopia, bilateral: Secondary | ICD-10-CM | POA: Diagnosis not present

## 2019-05-30 DIAGNOSIS — H5203 Hypermetropia, bilateral: Secondary | ICD-10-CM | POA: Diagnosis not present

## 2021-02-16 NOTE — L&D Delivery Note (Signed)
Delivery Note Charlene Hood was being admitted  from the triage room to a labor room when she reported s strong need to push. She was moved rapidly to LDR 4 and assisted into a birthing bed where she delivered rapidly.At 1753 a viable female was delivered vaginally over an intact perineum.  Loose nuchal cord was reduced on the perineum.The Nursery team was present with NNP in attendance (Presentation: LOA to ROP     ).  APGAR: 8,9 ; weight pending .   Placenta status:  ,intact, delivered at 1759  .  Cord:  3 vessel  with the following complications: none.    Anesthesia:  none Episiotomy:  none Lacerations:  none Suture Repair:  NA Est. Blood Loss (mL):  50 cc  Mom to postpartum.  Baby to [facility] care / [facility]  Mirna Mires 10/13/2021, 6:54 PM

## 2021-04-07 ENCOUNTER — Other Ambulatory Visit: Payer: Self-pay

## 2021-04-07 ENCOUNTER — Ambulatory Visit (LOCAL_COMMUNITY_HEALTH_CENTER): Payer: Medicaid Other

## 2021-04-07 VITALS — BP 114/72 | Ht 60.0 in | Wt 120.0 lb

## 2021-04-07 DIAGNOSIS — Z3201 Encounter for pregnancy test, result positive: Secondary | ICD-10-CM

## 2021-04-07 LAB — PREGNANCY, URINE: Preg Test, Ur: POSITIVE — AB

## 2021-04-07 MED ORDER — PRENATAL 27-0.8 MG PO TABS: 1.0000 | ORAL_TABLET | Freq: Every day | ORAL | 0 refills | Status: AC

## 2021-04-07 NOTE — Progress Notes (Signed)
UPT positive. Plans prenatal care at [facility]. To DSS for medicaid/preg women. Josie Saunders, RN

## 2021-05-06 ENCOUNTER — Telehealth: Payer: Self-pay | Admitting: Family Medicine

## 2021-05-06 NOTE — Telephone Encounter (Signed)
Pt already called to reschedule NEW OB appt ?

## 2021-05-15 ENCOUNTER — Ambulatory Visit: Payer: Medicaid Other | Admitting: Advanced Practice Midwife

## 2021-05-15 ENCOUNTER — Encounter: Payer: Self-pay | Admitting: Advanced Practice Midwife

## 2021-05-15 ENCOUNTER — Other Ambulatory Visit: Payer: Self-pay

## 2021-05-15 DIAGNOSIS — Z3482 Encounter for supervision of other normal pregnancy, second trimester: Secondary | ICD-10-CM | POA: Diagnosis not present

## 2021-05-15 DIAGNOSIS — O0930 Supervision of pregnancy with insufficient antenatal care, unspecified trimester: Secondary | ICD-10-CM

## 2021-05-15 DIAGNOSIS — F17200 Nicotine dependence, unspecified, uncomplicated: Secondary | ICD-10-CM

## 2021-05-15 DIAGNOSIS — Z818 Family history of other mental and behavioral disorders: Secondary | ICD-10-CM

## 2021-05-15 LAB — HEMOGLOBIN, FINGERSTICK: Hemoglobin: 12.2 g/dL (ref 11.1–15.9)

## 2021-05-15 LAB — URINALYSIS
Bilirubin, UA: NEGATIVE
Glucose, UA: NEGATIVE
Ketones, UA: NEGATIVE
Leukocytes,UA: NEGATIVE
Nitrite, UA: NEGATIVE
Protein,UA: NEGATIVE
RBC, UA: NEGATIVE
Specific Gravity, UA: 1.02 (ref 1.005–1.030)
Urobilinogen, Ur: 0.2 mg/dL (ref 0.2–1.0)
pH, UA: 6.5 (ref 5.0–7.5)

## 2021-05-15 LAB — WET PREP FOR TRICH, YEAST, CLUE
Trichomonas Exam: NEGATIVE
Yeast Exam: NEGATIVE

## 2021-05-15 NOTE — Progress Notes (Addendum)
Presents for initiation of prenatal care. Per client, no medical care thus far in pregnancy. Reports LNMP 01/18/2022 with 2-3 days of scant bleeding beginning 02/10/2022. Jossie Ng, RN ?Wet prep, urine dip and hgb reviewed - no interventions required per standing order.[facility] OB US scheduled for 05/27/2021 with arrival time of 1315 with a full bladder. Instructions provided to client with verbalized understanding. [facility] and Parking Map provided to client wit hwritten [place] appt date / time. Jossie Ng, RN ? ? ?

## 2021-05-15 NOTE — Progress Notes (Signed)
[facility] Department  ?Maternal Health Clinic ? ? ?INITIAL PRENATAL VISIT NOTE ? ?Subjective:  ?Charlene Hood is a 26 y.o.  SWF smoker G2P1001 (39 yo son) at [redacted]w[redacted]d being seen today to start prenatal care at the [facility] Department. She feels "good" about surprise pregnancy with no birth control. 26 yo unemployed FOB feels "happy" about pregnancy and is the father of her son; in supportive 6 year relationship. LNMP 01/18/21. LMP abnormally light 02/10/21. Denies u/s or ER use this pregnancy. Can't remember last pap. Smoking 6-7 cpd, last MJ 6 years ago, last ETOH 02/2021 (7 shots liquor) 1-2x/wk.  Denies vaping, cigars. She is currently monitored for the following issues for this low-risk pregnancy and has Family history of autism m.1st cousin; Late prenatal care 16 5/7; Prenatal care, subsequent pregnancy, second trimester; and Smoker 6-7 cpd on their problem list. ? ?Patient reports no complaints.  Contractions: Not present. Vag. Bleeding: None.  Movement: Absent. Denies leaking of fluid.  ? ?Indications for ASA therapy (per uptodate) ?One of the following: ?Previous pregnancy with preeclampsia, especially early onset and with an adverse outcome No ?Multifetal gestation No ?Chronic hypertension No ?Type 1 or 2 diabetes mellitus No ?Chronic kidney disease No ?Autoimmune disease (antiphospholipid syndrome, systemic lupus erythematosus) No ? ?Two or more of the following: ?Nulliparity No ?Obesity (body mass index >30 kg/m2) No ?Family history of preeclampsia in mother or sister No ?Age ?35 years No ?Sociodemographic characteristics (African American race, low socioeconomic level) No ?Personal risk factors (eg, previous pregnancy with low birth weight or small for gestational age infant, previous adverse pregnancy outcome [eg, stillbirth], interval >10 years between pregnancies) No ? ? ?The following portions of the patient's history were reviewed and updated as appropriate: allergies, current  medications, past family history, past medical history, past social history, past surgical history and problem list. Problem list updated. ? ?Objective:  ? ?Vitals:  ? 05/15/21 1357  ?BP: 114/68  ?Pulse: 91  ?Temp: (!) 97.4 ?F (36.3 ?C)  ?Weight: 119 lb 3.2 oz (54.1 kg)  ? ? ?Fetal Status: Fetal Heart Rate (bpm): 160 Fundal Height: 14 cm Movement: Absent  Presentation: Undeterminable ? ? ?Physical Exam ?Vitals and nursing note reviewed.  ?Constitutional:   ?   General: She is not in acute distress. ?   Appearance: Normal appearance. She is well-developed and normal weight.  ?HENT:  ?   Head: Normocephalic and atraumatic.  ?   Right Ear: External ear normal.  ?   Left Ear: External ear normal.  ?   Nose: Nose normal. No congestion or rhinorrhea.  ?   Mouth/Throat:  ?   Lips: Pink.  ?   Mouth: Mucous membranes are moist.  ?   Dentition: Normal dentition. No dental caries.  ?   Pharynx: Oropharynx is clear. Uvula midline.  ?   Comments: Dentition: poor with caries ?Last dental exam 2012 ?Eyes:  ?   General: No scleral icterus. ?   Conjunctiva/sclera: Conjunctivae normal.  ?Neck:  ?   Thyroid: No thyroid mass, thyromegaly or thyroid tenderness.  ?Cardiovascular:  ?   Rate and Rhythm: Normal rate.  ?   Pulses: Normal pulses.  ?   Comments: Extremities are warm and well perfused ?Pulmonary:  ?   Effort: Pulmonary effort is normal.  ?   Breath sounds: Normal breath sounds.  ?Chest:  ?   Chest wall: No mass.  ?Breasts: ?   Tanner Score is 5.  ?   Breasts  are symmetrical.  ?   Right: Normal. No mass, nipple discharge or skin change.  ?   Left: Normal. No mass, nipple discharge or skin change.  ?Abdominal:  ?   Palpations: Abdomen is soft.  ?   Tenderness: There is no abdominal tenderness.  ?   Comments: Gravid, good tone, soft without masses or tenderness, FHR=160, FH 14-16 wks  ?Genitourinary: ?   General: Normal vulva.  ?   Exam position: Lithotomy position.  ?   Pubic Area: No rash.   ?   Labia:     ?   Right: No rash.      ?   Left: No rash.   ?   Vagina: Vaginal discharge (white creamy leukorrhea, ph< 4.5) present.  ?   Cervix: Normal.  ?   Uterus: Normal. Enlarged (Gravid 14-16 wks size). Not tender.   ?   Adnexa: Right adnexa normal and left adnexa normal.  ?   Rectum: Normal. No external hemorrhoid.  ?Musculoskeletal:  ?   Right lower leg: No edema.  ?   Left lower leg: No edema.  ?Lymphadenopathy:  ?   Cervical: No cervical adenopathy.  ?   Upper Body:  ?   Right upper body: No axillary adenopathy.  ?   Left upper body: No axillary adenopathy.  ?Skin: ?   General: Skin is warm.  ?   Capillary Refill: Capillary refill takes less than 2 seconds.  ?Neurological:  ?   Mental Status: She is alert.  ? ? ?Assessment and Plan:  ?Pregnancy: G2P1001 at [redacted]w[redacted]d ? ?1. Late prenatal care 16 5/7 ? ? ?2. Prenatal care, subsequent pregnancy, second trimester ?Counseled on weight gain of 25-35 lbs this pregnancy ?Declines genetic counseling and genetic screening ?Please give dental list to pt ?U/s ordered for dating ? ?- Pap IG (Image Guided) ?- [id number] Drug Screen ?- Urine Culture & Sensitivity ?- WET PREP FOR TRICH, YEAST, CLUE ?- Urinalysis (Urine Dip) ?- Hemoglobin, venipuncture ?- Chlamydia/GC NAA, Confirmation ?- Lead, blood (adult age 26 yrs or greater) ?- HIV-1/HIV-2 Qualitative RNA ?- HCV Ab w Reflex to Quant PCR ?- Prenatal profile without Varicella or Rubella ? ?3. Smoker 6-7 cpd ?Counseled via 5 A's to stop smoking ? ?4. Family history of autism m.1st cousin ?Declines genetic counseling and screening ? ? ? ?Discussed overview of care and coordination with inpatient delivery practices including [facility], [facility], [facility] and [facility].  ? ?Reviewed Centering pregnancy as standard of care at [facility] ? ? ?Preterm labor symptoms and general obstetric precautions including but not limited to vaginal bleeding, contractions, leaking of fluid and fetal movement were reviewed in detail with the patient. ? ?Please refer to After Visit  Summary for other counseling recommendations.  ? ?Return in about 4 weeks (around 06/12/2021) for routine PNC. ? ?No future appointments. ? ?Herbie Saxon, CNM ? ?

## 2021-05-16 LAB — CBC/D/PLT+RPR+RH+ABO+AB SCR
Antibody Screen: NEGATIVE
Basophils Absolute: 0 10*3/uL (ref 0.0–0.2)
Basos: 0 %
EOS (ABSOLUTE): 0.1 10*3/uL (ref 0.0–0.4)
Eos: 1 %
Hematocrit: 35.3 % (ref 34.0–46.6)
Hemoglobin: 12.3 g/dL (ref 11.1–15.9)
Hepatitis B Surface Ag: NEGATIVE
Immature Grans (Abs): 0 10*3/uL (ref 0.0–0.1)
Immature Granulocytes: 0 %
Lymphocytes Absolute: 1.6 10*3/uL (ref 0.7–3.1)
Lymphs: 14 %
MCH: 31.9 pg (ref 26.6–33.0)
MCHC: 34.8 g/dL (ref 31.5–35.7)
MCV: 92 fL (ref 79–97)
Monocytes Absolute: 0.4 10*3/uL (ref 0.1–0.9)
Monocytes: 4 %
Neutrophils Absolute: 9.1 10*3/uL — ABNORMAL HIGH (ref 1.4–7.0)
Neutrophils: 81 %
Platelets: 257 10*3/uL (ref 150–450)
RBC: 3.85 x10E6/uL (ref 3.77–5.28)
RDW: 11.5 % — ABNORMAL LOW (ref 11.7–15.4)
RPR Ser Ql: NONREACTIVE
Rh Factor: POSITIVE
WBC: 11.3 10*3/uL — ABNORMAL HIGH (ref 3.4–10.8)

## 2021-05-16 LAB — HCV AB W REFLEX TO QUANT PCR: HCV Ab: NONREACTIVE

## 2021-05-16 LAB — HCV INTERPRETATION

## 2021-05-17 LAB — 789231 7+OXYCODONE-BUND
Amphetamines, Urine: NEGATIVE ng/mL
BENZODIAZ UR QL: NEGATIVE ng/mL
Barbiturate screen, urine: NEGATIVE ng/mL
Cannabinoid Quant, Ur: NEGATIVE ng/mL
Cocaine (Metab.): NEGATIVE ng/mL
OPIATE SCREEN URINE: NEGATIVE ng/mL
Oxycodone/Oxymorphone, Urine: NEGATIVE ng/mL
PCP Quant, Ur: NEGATIVE ng/mL

## 2021-05-17 LAB — LEAD, BLOOD (ADULT >= 16 YRS): Lead-Whole Blood: <1 ug/dL (ref 0.0–3.4)

## 2021-05-17 LAB — CHLAMYDIA/GC NAA, CONFIRMATION
Chlamydia trachomatis, NAA: NEGATIVE
Neisseria gonorrhoeae, NAA: NEGATIVE

## 2021-05-17 LAB — HIV-1/HIV-2 QUALITATIVE RNA
HIV-1 RNA, Qualitative: NONREACTIVE
HIV-2 RNA, Qualitative: NONREACTIVE

## 2021-05-18 LAB — URINE CULTURE

## 2021-05-20 LAB — PAP IG (IMAGE GUIDED): PAP Smear Comment: 0

## 2021-05-21 ENCOUNTER — Telehealth: Payer: Self-pay

## 2021-05-21 NOTE — Telephone Encounter (Signed)
Call to client to notify her [facility] had to reschedule her 05/27/2021 [place] to 06/02/2021 with arrival time of 1245. [place] [facility] and to arrive with a full bladder. Call to client with above information and agreeable with plan. Jossie Ng, RN ? ?

## 2021-05-27 ENCOUNTER — Ambulatory Visit: Admission: RE | Admit: 2021-05-27 | Payer: Medicaid Other | Source: Ambulatory Visit

## 2021-06-02 ENCOUNTER — Ambulatory Visit
Admission: RE | Admit: 2021-06-02 | Discharge: 2021-06-02 | Disposition: A | Discharge status: Home / Self Care | Payer: Medicaid Other | Source: Ambulatory Visit | Attending: Advanced Practice Midwife | Admitting: Advanced Practice Midwife

## 2021-06-02 DIAGNOSIS — Z3A15 15 weeks gestation of pregnancy: Secondary | ICD-10-CM | POA: Diagnosis not present

## 2021-06-02 DIAGNOSIS — Z3482 Encounter for supervision of other normal pregnancy, second trimester: Secondary | ICD-10-CM

## 2021-06-02 DIAGNOSIS — Z3492 Encounter for supervision of normal pregnancy, unspecified, second trimester: Secondary | ICD-10-CM | POA: Diagnosis not present

## 2021-06-04 ENCOUNTER — Encounter: Payer: Self-pay | Admitting: Advanced Practice Midwife

## 2021-06-04 DIAGNOSIS — Z3482 Encounter for supervision of other normal pregnancy, second trimester: Secondary | ICD-10-CM

## 2021-06-05 ENCOUNTER — Encounter: Payer: Self-pay | Admitting: Advanced Practice Midwife

## 2021-06-11 ENCOUNTER — Encounter: Payer: Self-pay | Admitting: Advanced Practice Midwife

## 2021-06-12 ENCOUNTER — Telehealth: Payer: Self-pay

## 2021-06-12 ENCOUNTER — Ambulatory Visit: Payer: Medicaid Other

## 2021-06-12 NOTE — Telephone Encounter (Signed)
Per Epic appt desk, client has now rescheduled [facility] appt for 06/18/2021. Jossie Ng, RN ? ?

## 2021-06-12 NOTE — Telephone Encounter (Signed)
Client [facility] for [facility] RV on 06/12/2021 as scheduled. Call to client and per recorded message, voicemail box is not set up. Call to emergency contact (brother) and left voicemail requesting assistance contacting client to have her reschedule missed [facility] appt. Number to call provided. Note added to pink sticky to request client set up voicemail. Jossie Ng, RN ? ?

## 2021-06-16 ENCOUNTER — Encounter: Payer: Self-pay | Admitting: Advanced Practice Midwife

## 2021-06-18 ENCOUNTER — Ambulatory Visit: Payer: Medicaid Other

## 2021-06-26 ENCOUNTER — Telehealth: Payer: Self-pay

## 2021-06-26 ENCOUNTER — Ambulatory Visit: Payer: Medicaid Other | Admitting: Advanced Practice Midwife

## 2021-06-26 VITALS — BP 105/63 | HR 87 | Temp 98.3°F | Wt 122.2 lb

## 2021-06-26 DIAGNOSIS — O0930 Supervision of pregnancy with insufficient antenatal care, unspecified trimester: Secondary | ICD-10-CM

## 2021-06-26 DIAGNOSIS — O0932 Supervision of pregnancy with insufficient antenatal care, second trimester: Secondary | ICD-10-CM

## 2021-06-26 DIAGNOSIS — Z818 Family history of other mental and behavioral disorders: Secondary | ICD-10-CM

## 2021-06-26 DIAGNOSIS — F17200 Nicotine dependence, unspecified, uncomplicated: Secondary | ICD-10-CM

## 2021-06-26 DIAGNOSIS — Z3482 Encounter for supervision of other normal pregnancy, second trimester: Secondary | ICD-10-CM

## 2021-06-26 NOTE — Progress Notes (Signed)
Requested she set up voicemail on her cell phone. Undecided as to postpartum BCM - verified has Animal nutritionist. Kept 06/02/2021 [place] appt. Rich Number, RN ? ? ?

## 2021-06-26 NOTE — Telephone Encounter (Signed)
[facility] [place] follow-up ordered electronically as RN inadvertently told provider prior US was done at  ?

## 2021-06-26 NOTE — Telephone Encounter (Signed)
Provider requested to re-order [place] as not done at [facility] as told to her by RN. [place] was completed at [facility]. Rich Number, RN ? ?

## 2021-06-26 NOTE — Progress Notes (Signed)
[facility] Department ?Maternal Health Clinic ? ?PRENATAL VISIT NOTE ? ?Subjective:  ?Charlene Hood is a 26 y.o. G2P1001 at [redacted]w[redacted]d being seen today for ongoing prenatal care.  She is currently monitored for the following issues for this low-risk pregnancy and has Family history of autism m.1st cousin; Late prenatal care 16 5/7; Prenatal care, subsequent pregnancy, second trimester; and Smoker 6-7 cpd on their problem list. ? ?Patient reports no complaints.  Contractions: Not present. Vag. Bleeding: None.  Movement: Present. Denies leaking of fluid/ROM.  ? ?The following portions of the patient's history were reviewed and updated as appropriate: allergies, current medications, past family history, past medical history, past social history, past surgical history and problem list. Problem list updated. ? ?Objective:  ? ?Vitals:  ? 06/26/21 1435  ?BP: 105/63  ?Pulse: 87  ?Temp: 98.3 ?F (36.8 ?C)  ?Weight: 122 lb 3.2 oz (55.4 kg)  ? ? ?Fetal Status: Fetal Heart Rate (bpm): 150 Fundal Height: 20 cm Movement: Present    ? ?General:  Alert, oriented and cooperative. Patient is in no acute distress.  ?Skin: Skin is warm and dry. No rash noted.   ?Cardiovascular: Normal heart rate noted  ?Respiratory: Normal respiratory effort, no problems with respiration noted  ?Abdomen: Soft, gravid, appropriate for gestational age.  Pain/Pressure: Absent     ?Pelvic: Cervical exam deferred        ?Extremities: Normal range of motion.     ?Mental Status: Normal mood and affect. Normal behavior. Normal judgment and thought content.  ? ?Assessment and Plan:  ?Pregnancy: G2P1001 at [redacted]w[redacted]d ? ?1. Late prenatal care 16 5/7 ? ? ?2. Smoker 6-7 cpd ?States now 4-7 cpd ? ?3. Prenatal care, subsequent pregnancy, second trimester ?7 lb 3.2 oz (3.266 kg) ?Working 21 hrs/wk, not in school ?Living with FOB and her 38 yo son ?Declined Quad screen ?Has not scheduled dental exam yet ?Walking 3x/wk x 20 min ?Reviewed 06/02/21 u/s at 15 6/7 with  AFI wnl, posterior placenta, 3VC, suboptimal viusualization of heart. Change of EDC to 11/18/21 ?Anatomy u/s ordered with MFM ? ?4. Family history of autism m.1st cousin ?Pt declines genetic screening and testing ? ? ?Preterm labor symptoms and general obstetric precautions including but not limited to vaginal bleeding, contractions, leaking of fluid and fetal movement were reviewed in detail with the patient. ?Please refer to After Visit Summary for other counseling recommendations.  ?Return in about 4 weeks (around 07/24/2021) for routine PNC. ? ?Future Appointments  ?Date Time Provider [place]  ?07/24/2021  2:00 PM [facility] PROVIDER [facility] None  ? ? ?Herbie Saxon, CNM ? ?

## 2021-06-27 ENCOUNTER — Telehealth: Payer: Self-pay

## 2021-06-27 NOTE — Telephone Encounter (Signed)
Call received from [facility] [place] scheduler and [place] appt is 07/31/2021 at 0800 (arrive 0745). Call to client and appt date / arrival time given. Facility directions also verbally provided. Jossie Ng, RN ? ?

## 2021-07-10 ENCOUNTER — Other Ambulatory Visit: Payer: Self-pay

## 2021-07-24 ENCOUNTER — Ambulatory Visit: Payer: Medicaid Other | Admitting: Advanced Practice Midwife

## 2021-07-24 VITALS — BP 119/69 | HR 102 | Temp 98.0°F | Wt 123.2 lb

## 2021-07-24 DIAGNOSIS — O0930 Supervision of pregnancy with insufficient antenatal care, unspecified trimester: Secondary | ICD-10-CM

## 2021-07-24 DIAGNOSIS — Z818 Family history of other mental and behavioral disorders: Secondary | ICD-10-CM

## 2021-07-24 DIAGNOSIS — F17200 Nicotine dependence, unspecified, uncomplicated: Secondary | ICD-10-CM

## 2021-07-24 DIAGNOSIS — Z3482 Encounter for supervision of other normal pregnancy, second trimester: Secondary | ICD-10-CM

## 2021-07-24 DIAGNOSIS — O0932 Supervision of pregnancy with insufficient antenatal care, second trimester: Secondary | ICD-10-CM

## 2021-07-24 NOTE — Progress Notes (Signed)
[facility] [facility]  PRENATAL VISIT NOTE  Subjective:  Charlene Hood is a 26 y.o. G2P1001 at [redacted]w[redacted]d being seen today for ongoing prenatal care.  She is currently monitored for the following issues for this low-risk pregnancy and has Family history of autism m.1st cousin; Late prenatal care 16 5/7; Prenatal care, subsequent pregnancy, second trimester; and Smoker 6-7 cpd on their problem list.  Patient reports no complaints.  Contractions: Not present. Vag. Bleeding: None.  Movement: Present. Denies leaking of fluid/ROM.   The following portions of the patient's history were reviewed and updated as appropriate: allergies, current medications, past family history, past medical history, past social history, past surgical history and problem list. Problem list updated.  Objective:   Vitals:   07/24/21 1406  BP: 119/69  Pulse: (!) 102  Temp: 98 F (36.7 C)  Weight: 123 lb 3.2 oz (55.9 kg)    Fetal Status: Fetal Heart Rate (bpm): 160 Fundal Height: 23 cm Movement: Present     General:  Alert, oriented and cooperative. Patient is in no acute distress.  Skin: Skin is warm and dry. No rash noted.   Cardiovascular: Normal heart rate noted  Respiratory: Normal respiratory effort, no problems with respiration noted  Abdomen: Soft, gravid, appropriate for gestational age.  Pain/Pressure: Absent     Pelvic: Cervical exam deferred        Extremities: Normal range of motion.  Edema: None  Mental Status: Normal mood and affect. Normal behavior. Normal judgment and thought content.   Assessment and Plan:  Pregnancy: G2P1001 at [redacted]w[redacted]d  1. Prenatal care, subsequent pregnancy, second trimester 8 lb 3.2 oz (3.719 kg) Quit working 06/27/21 Not exercising--encouraged to do so 3x/wk Living with FOB and her 38 yo son, who are present for apt Reminded of anatomy u/s on 07/31/21  2. Smoker 6-7 cpd 6-10 cpd Counseled via 5 A's to stop smoking  3. Late prenatal  care 16 5/7   4. Family history of autism m.1st cousin Declines genetic counseling and Quad screen    Preterm labor symptoms and general obstetric precautions including but not limited to vaginal bleeding, contractions, leaking of fluid and fetal movement were reviewed in detail with the patient. Please refer to After Visit Summary for other counseling recommendations.  No follow-ups on file.  Future Appointments  Date Time Provider Wise  07/31/2021  8:00 AM [facility] [place], CNM

## 2021-07-24 NOTE — Progress Notes (Signed)
Patient here for MH RV at 23 2/7. S/S PTL reviewed with patient and literature given. Patient still unsure of BCM. Aware of [facility] U/S on 07/31/21. States she set up her VM today. Needs more PNV, pharmacy verified.Marland KitchenMarland KitchenBurt Knack, RN

## 2021-07-31 ENCOUNTER — Ambulatory Visit: Payer: Medicaid Other | Attending: Obstetrics

## 2021-07-31 ENCOUNTER — Other Ambulatory Visit: Payer: Self-pay

## 2021-07-31 DIAGNOSIS — Z363 Encounter for antenatal screening for malformations: Secondary | ICD-10-CM | POA: Diagnosis present

## 2021-07-31 DIAGNOSIS — O359XX0 Maternal care for (suspected) fetal abnormality and damage, unspecified, not applicable or unspecified: Secondary | ICD-10-CM | POA: Diagnosis not present

## 2021-07-31 DIAGNOSIS — Z3482 Encounter for supervision of other normal pregnancy, second trimester: Secondary | ICD-10-CM

## 2021-07-31 DIAGNOSIS — Z3A24 24 weeks gestation of pregnancy: Secondary | ICD-10-CM

## 2021-08-05 ENCOUNTER — Encounter: Payer: Self-pay | Admitting: Advanced Practice Midwife

## 2021-08-21 ENCOUNTER — Ambulatory Visit: Payer: Medicaid Other

## 2021-08-26 ENCOUNTER — Telehealth: Payer: Self-pay

## 2021-08-26 ENCOUNTER — Ambulatory Visit: Payer: Medicaid Other

## 2021-08-26 NOTE — Telephone Encounter (Signed)
Patient's brother, Loralee Weitzman, returned phone call and states he is going to work now but will visit his sister and her use his phone to return phone call to maternity clinic to reschedule missed appt.   Earlyne Iba, RN

## 2021-08-26 NOTE — Telephone Encounter (Signed)
[facility] for MHC RV 08/26/2021. Call to client to reschedule appt and continuous beeping sound heard immediately after dialing number. Call to brother (emergency contact) and left message requesting assistance contacting client to schedule appt in [facility]. Number to call provided. Jossie Ng, RN

## 2021-08-26 NOTE — Telephone Encounter (Signed)
Per Epic, MHC RV appt scheduled for 09/04/2021. Jossie Ng, RN

## 2021-09-04 ENCOUNTER — Ambulatory Visit: Payer: Medicaid Other

## 2021-09-08 ENCOUNTER — Ambulatory Visit: Payer: Medicaid Other

## 2021-09-16 ENCOUNTER — Ambulatory Visit: Payer: Medicaid Other | Admitting: Advanced Practice Midwife

## 2021-09-16 VITALS — BP 105/63 | HR 98 | Temp 98.0°F | Wt 127.4 lb

## 2021-09-16 DIAGNOSIS — Z23 Encounter for immunization: Secondary | ICD-10-CM

## 2021-09-16 DIAGNOSIS — O09899 Supervision of other high risk pregnancies, unspecified trimester: Secondary | ICD-10-CM | POA: Insufficient documentation

## 2021-09-16 DIAGNOSIS — O99019 Anemia complicating pregnancy, unspecified trimester: Secondary | ICD-10-CM | POA: Insufficient documentation

## 2021-09-16 DIAGNOSIS — Z91199 Patient's noncompliance with other medical treatment and regimen due to unspecified reason: Secondary | ICD-10-CM | POA: Insufficient documentation

## 2021-09-16 DIAGNOSIS — O99013 Anemia complicating pregnancy, third trimester: Secondary | ICD-10-CM

## 2021-09-16 DIAGNOSIS — Z3482 Encounter for supervision of other normal pregnancy, second trimester: Secondary | ICD-10-CM

## 2021-09-16 DIAGNOSIS — Z3483 Encounter for supervision of other normal pregnancy, third trimester: Secondary | ICD-10-CM

## 2021-09-16 DIAGNOSIS — O09893 Supervision of other high risk pregnancies, third trimester: Secondary | ICD-10-CM

## 2021-09-16 LAB — URINALYSIS
Bilirubin, UA: NEGATIVE
Glucose, UA: NEGATIVE
Ketones, UA: NEGATIVE
Nitrite, UA: NEGATIVE
Protein,UA: NEGATIVE
RBC, UA: NEGATIVE
Specific Gravity, UA: 1.015 (ref 1.005–1.030)
Urobilinogen, Ur: 0.2 mg/dL (ref 0.2–1.0)
pH, UA: 7 (ref 5.0–7.5)

## 2021-09-16 LAB — HEMOGLOBIN, FINGERSTICK: Hemoglobin: 10.3 g/dL — ABNORMAL LOW (ref 11.1–15.9)

## 2021-09-16 MED ORDER — FERROUS SULFATE 325 (65 FE) MG PO TABS: 325.0000 mg | ORAL_TABLET | Freq: Every day | ORAL | 3 refills | Status: AC

## 2021-09-16 NOTE — Progress Notes (Signed)
Hgb reviewed during clinic visit.   Per standing order: Dispensed ferrous sulfate 325mg and given to patient.   Instructed to take one tablet daily with juice that has Vit C such as orange, apple, or grape juice.  Anemia panel order added.  Anemia in pregnancy added to problem list.  Counsel patient on Fe-rich foods and Anemia in Pregnancy Pamphlet given.   Patient verbalized understanding. Questions answered.   Livia Tarr Ramos, RN  

## 2021-09-16 NOTE — Progress Notes (Signed)
[facility]  PRENATAL VISIT NOTE  Subjective:  Charlene Hood is a 26 y.o. G2P1001 at [redacted]w[redacted]d being seen today for ongoing prenatal care.  She is currently monitored for the following issues for this high-risk pregnancy and has Family history of autism m.1st cousin; Late prenatal care 16 5/7; Prenatal care, subsequent pregnancy, second trimester; Smoker 6-10 cpd; and Noncompliant pregnant patient; no prenatal care x 8 wks on their problem list.  Patient reports no complaints.  Contractions: Not present. Vag. Bleeding: None.  Movement: Present. Denies leaking of fluid/ROM.   The following portions of the patient's history were reviewed and updated as appropriate: allergies, current medications, past family history, past medical history, past social history, past surgical history and problem list. Problem list updated.  Objective:   Vitals:   09/16/21 1308  BP: 105/63  Pulse: 98  Temp: 98 F (36.7 C)  Weight: 127 lb 6.4 oz (57.8 kg)    Fetal Status: Fetal Heart Rate (bpm): 120 Fundal Height: 32 cm Movement: Present     General:  Alert, oriented and cooperative. Patient is in no acute distress.  Skin: Skin is warm and dry. No rash noted.   Cardiovascular: Normal heart rate noted  Respiratory: Normal respiratory effort, no problems with respiration noted  Abdomen: Soft, gravid, appropriate for gestational age.  Pain/Pressure: Absent     Pelvic: Cervical exam deferred        Extremities: Normal range of motion.  Edema: None  Mental Status: Normal mood and affect. Normal behavior. Normal judgment and thought content.   Assessment and Plan:  Pregnancy: G2P1001 at [redacted]w[redacted]d  1. Prenatal care, subsequent pregnancy, second trimester Not working Living with FOB and 68 yo son who are present for exam 1 hour glucola today Not exercising To have baby shower 10/12/21 Smoking 6-10 cpd Reviewed 07/31/21 u/s at 24 2/7 with posterior placenta, EFW=41%,  AFI wnl, appropriate growth, anatomy wnl, 3VC 12 lb 6.4 oz (5.625 kg) Hasn't made dental apt yet; has dental list and needs to call to schedule  - Hemoglobin, fingerstick - Glucose, 1 hour gestational - HIV-1/HIV-2 Qualitative RNA - RPR - Tdap vaccine greater than or equal to 7yo IM - [id number] Drug Screen - Urinalysis (Urine Dip)  2. Noncompliant pregnant patient in third trimester No prenatal care past 8 wks and late for 1 hour glucola T/c to [facility] to contact pt and assist with transportation; emergency contact FOB: [phone]   Preterm labor symptoms and general obstetric precautions including but not limited to vaginal bleeding, contractions, leaking of fluid and fetal movement were reviewed in detail with the patient. Please refer to After Visit Summary for other counseling recommendations.  No follow-ups on file.  No future appointments.  Alberteen Spindle, CNM

## 2021-09-16 NOTE — Addendum Note (Signed)
Addended by: Earlyne Iba on: 09/16/2021 02:49 PM   Modules accepted: Orders

## 2021-09-16 NOTE — Addendum Note (Signed)
Addended by: Earlyne Iba on: 09/16/2021 02:36 PM   Modules accepted: Orders

## 2021-09-17 LAB — FE+CBC/D/PLT+TIBC+FER+RETIC
Basophils Absolute: 0 10*3/uL (ref 0.0–0.2)
Basos: 0 %
EOS (ABSOLUTE): 0.1 10*3/uL (ref 0.0–0.4)
Eos: 1 %
Ferritin: 20 ng/mL (ref 15–150)
Hematocrit: 31.3 % — ABNORMAL LOW (ref 34.0–46.6)
Hemoglobin: 10.3 g/dL — ABNORMAL LOW (ref 11.1–15.9)
Immature Grans (Abs): 0.1 10*3/uL (ref 0.0–0.1)
Immature Granulocytes: 1 %
Iron Saturation: 44 % (ref 15–55)
Iron: 133 ug/dL (ref 27–159)
Lymphocytes Absolute: 2.1 10*3/uL (ref 0.7–3.1)
Lymphs: 16 %
MCH: 31.3 pg (ref 26.6–33.0)
MCHC: 32.9 g/dL (ref 31.5–35.7)
MCV: 95 fL (ref 79–97)
Monocytes Absolute: 0.8 10*3/uL (ref 0.1–0.9)
Monocytes: 6 %
Neutrophils Absolute: 10.3 10*3/uL — ABNORMAL HIGH (ref 1.4–7.0)
Neutrophils: 76 %
Platelets: 310 10*3/uL (ref 150–450)
RBC: 3.29 x10E6/uL — ABNORMAL LOW (ref 3.77–5.28)
RDW: 12.7 % (ref 11.7–15.4)
Retic Ct Pct: 2 % (ref 0.6–2.6)
Total Iron Binding Capacity: 302 ug/dL (ref 250–450)
UIBC: 169 ug/dL (ref 131–425)
WBC: 13.4 10*3/uL — ABNORMAL HIGH (ref 3.4–10.8)

## 2021-09-17 LAB — 789231 7+OXYCODONE-BUND
Amphetamines, Urine: NEGATIVE ng/mL
BENZODIAZ UR QL: NEGATIVE ng/mL
Barbiturate screen, urine: NEGATIVE ng/mL
Cannabinoid Quant, Ur: NEGATIVE ng/mL
Cocaine (Metab.): NEGATIVE ng/mL
OPIATE SCREEN URINE: NEGATIVE ng/mL
Oxycodone/Oxymorphone, Urine: NEGATIVE ng/mL
PCP Quant, Ur: NEGATIVE ng/mL

## 2021-09-18 LAB — HIV-1/HIV-2 QUALITATIVE RNA
HIV-1 RNA, Qualitative: NONREACTIVE
HIV-2 RNA, Qualitative: NONREACTIVE

## 2021-09-18 LAB — RPR: RPR Ser Ql: NONREACTIVE

## 2021-09-18 LAB — GLUCOSE, 1 HOUR GESTATIONAL: Gestational Diabetes Screen: 111 mg/dL (ref 70–139)

## 2021-09-23 ENCOUNTER — Ambulatory Visit: Payer: Medicaid Other

## 2021-09-30 ENCOUNTER — Ambulatory Visit: Payer: Medicaid Other | Admitting: Advanced Practice Midwife

## 2021-09-30 VITALS — BP 112/61 | HR 96 | Temp 97.2°F | Wt 126.2 lb

## 2021-09-30 DIAGNOSIS — Z3483 Encounter for supervision of other normal pregnancy, third trimester: Secondary | ICD-10-CM

## 2021-09-30 DIAGNOSIS — F17200 Nicotine dependence, unspecified, uncomplicated: Secondary | ICD-10-CM

## 2021-09-30 DIAGNOSIS — Z91199 Patient's noncompliance with other medical treatment and regimen due to unspecified reason: Secondary | ICD-10-CM

## 2021-09-30 DIAGNOSIS — O99013 Anemia complicating pregnancy, third trimester: Secondary | ICD-10-CM

## 2021-09-30 DIAGNOSIS — O09893 Supervision of other high risk pregnancies, third trimester: Secondary | ICD-10-CM

## 2021-09-30 DIAGNOSIS — O0933 Supervision of pregnancy with insufficient antenatal care, third trimester: Secondary | ICD-10-CM

## 2021-09-30 DIAGNOSIS — Z3482 Encounter for supervision of other normal pregnancy, second trimester: Secondary | ICD-10-CM

## 2021-09-30 DIAGNOSIS — O0930 Supervision of pregnancy with insufficient antenatal care, unspecified trimester: Secondary | ICD-10-CM

## 2021-09-30 NOTE — Progress Notes (Signed)
[facility]  PRENATAL VISIT NOTE  Subjective:  Charlene Hood is a 26 y.o. G2P1001 at [redacted]w[redacted]d being seen today for ongoing prenatal care.  She is currently monitored for the following issues for this low-risk pregnancy and has Family history of autism m.1st cousin; Late prenatal care 16 5/7; Prenatal care, subsequent pregnancy, second trimester; Smoker 6-10 cpd; Noncompliant pregnant patient; no prenatal care x 8 wks; and Anemia affecting pregnancy on their problem list.  Patient reports no complaints.  Contractions: Not present. Vag. Bleeding: None.  Movement: Present. Denies leaking of fluid/ROM.   The following portions of the patient's history were reviewed and updated as appropriate: allergies, current medications, past family history, past medical history, past social history, past surgical history and problem list. Problem list updated.  Objective:   Vitals:   09/30/21 1347  BP: 112/61  Pulse: 96  Temp: (!) 97.2 F (36.2 C)  Weight: 126 lb 3.2 oz (57.2 kg)    Fetal Status: Fetal Heart Rate (bpm): 130 Fundal Height: 34 cm Movement: Present     General:  Alert, oriented and cooperative. Patient is in no acute distress.  Skin: Skin is warm and dry. No rash noted.   Cardiovascular: Normal heart rate noted  Respiratory: Normal respiratory effort, no problems with respiration noted  Abdomen: Soft, gravid, appropriate for gestational age.  Pain/Pressure: Absent     Pelvic: Cervical exam deferred        Extremities: Normal range of motion.  Edema: None  Mental Status: Normal mood and affect. Normal behavior. Normal judgment and thought content.   Assessment and Plan:  Pregnancy: G2P1001 at [redacted]w[redacted]d  1. Anemia affecting pregnancy in third trimester Taking 1 FeSo4 daily with apple juice  2. Late prenatal care 16 5/7   3. Smoker 6-10 cpd Still smoking  4. Noncompliant pregnant patient in third trimester   5. Prenatal care,  subsequent pregnancy, second trimester Called for dental exam and they will call her back soon 1 hour glucola=09/16/21=111 Baby shower 10/12/21 11 lb 3.2 oz (5.08 kg) 1 lb wt loss in last 2 wks Not working Walking 3-4x/wk x 15 min [facility] in to see pt   Preterm labor symptoms and general obstetric precautions including but not limited to vaginal bleeding, contractions, leaking of fluid and fetal movement were reviewed in detail with the patient. Please refer to After Visit Summary for other counseling recommendations.  No follow-ups on file.  No future appointments.  Alberteen Spindle, CNM

## 2021-10-13 ENCOUNTER — Inpatient Hospital Stay
Admission: EM | Admit: 2021-10-13 | Discharge: 2021-10-15 | DRG: 805 | Disposition: A | Discharge status: Home / Self Care | Payer: Medicaid Other | Attending: Certified Nurse Midwife | Admitting: Certified Nurse Midwife

## 2021-10-13 ENCOUNTER — Other Ambulatory Visit: Payer: Self-pay

## 2021-10-13 ENCOUNTER — Telehealth: Payer: Self-pay | Admitting: Family Medicine

## 2021-10-13 ENCOUNTER — Encounter: Payer: Self-pay | Admitting: Obstetrics and Gynecology

## 2021-10-13 DIAGNOSIS — O26893 Other specified pregnancy related conditions, third trimester: Secondary | ICD-10-CM | POA: Diagnosis present

## 2021-10-13 DIAGNOSIS — O99334 Smoking (tobacco) complicating childbirth: Secondary | ICD-10-CM | POA: Diagnosis present

## 2021-10-13 DIAGNOSIS — N898 Other specified noninflammatory disorders of vagina: Principal | ICD-10-CM | POA: Diagnosis present

## 2021-10-13 DIAGNOSIS — O42913 Preterm premature rupture of membranes, unspecified as to length of time between rupture and onset of labor, third trimester: Secondary | ICD-10-CM | POA: Diagnosis present

## 2021-10-13 DIAGNOSIS — O0933 Supervision of pregnancy with insufficient antenatal care, third trimester: Secondary | ICD-10-CM

## 2021-10-13 DIAGNOSIS — Z23 Encounter for immunization: Secondary | ICD-10-CM | POA: Diagnosis not present

## 2021-10-13 DIAGNOSIS — F17210 Nicotine dependence, cigarettes, uncomplicated: Secondary | ICD-10-CM | POA: Diagnosis present

## 2021-10-13 DIAGNOSIS — Z3A34 34 weeks gestation of pregnancy: Secondary | ICD-10-CM | POA: Diagnosis not present

## 2021-10-13 DIAGNOSIS — O42013 Preterm premature rupture of membranes, onset of labor within 24 hours of rupture, third trimester: Secondary | ICD-10-CM

## 2021-10-13 LAB — CBC
HCT: 31.6 % — ABNORMAL LOW (ref 36.0–46.0)
Hemoglobin: 10.9 g/dL — ABNORMAL LOW (ref 12.0–15.0)
MCH: 31.2 pg (ref 26.0–34.0)
MCHC: 34.5 g/dL (ref 30.0–36.0)
MCV: 90.5 fL (ref 80.0–100.0)
Platelets: 281 10*3/uL (ref 150–400)
RBC: 3.49 MIL/uL — ABNORMAL LOW (ref 3.87–5.11)
RDW: 13.2 % (ref 11.5–15.5)
WBC: 17.9 10*3/uL — ABNORMAL HIGH (ref 4.0–10.5)
nRBC: 0 % (ref 0.0–0.2)

## 2021-10-13 LAB — URINALYSIS, COMPLETE (UACMP) WITH MICROSCOPIC
Bacteria, UA: NONE SEEN
Bilirubin Urine: NEGATIVE
Glucose, UA: NEGATIVE mg/dL
Ketones, ur: 5 mg/dL — AB
Leukocytes,Ua: NEGATIVE
Nitrite: NEGATIVE
Protein, ur: NEGATIVE mg/dL
Specific Gravity, Urine: 1.004 — ABNORMAL LOW (ref 1.005–1.030)
pH: 7 (ref 5.0–8.0)

## 2021-10-13 LAB — URINE DRUG SCREEN, QUALITATIVE (ARMC ONLY)
Amphetamines, Ur Screen: NOT DETECTED
Barbiturates, Ur Screen: NOT DETECTED
Benzodiazepine, Ur Scrn: NOT DETECTED
Cannabinoid 50 Ng, Ur ~~LOC~~: NOT DETECTED
Cocaine Metabolite,Ur ~~LOC~~: NOT DETECTED
MDMA (Ecstasy)Ur Screen: NOT DETECTED
Methadone Scn, Ur: NOT DETECTED
Opiate, Ur Screen: NOT DETECTED
Phencyclidine (PCP) Ur S: NOT DETECTED
Tricyclic, Ur Screen: NOT DETECTED

## 2021-10-13 LAB — RUPTURE OF MEMBRANE (ROM)PLUS: Rom Plus: POSITIVE

## 2021-10-13 LAB — TYPE AND SCREEN
ABO/RH(D): A POS
Antibody Screen: NEGATIVE

## 2021-10-13 LAB — GROUP B STREP BY PCR: Group B strep by PCR: NEGATIVE

## 2021-10-13 MED ORDER — OXYTOCIN-SODIUM CHLORIDE 30-0.9 UT/500ML-% IV SOLN
2.5000 [IU]/h | INTRAVENOUS | Status: DC
  Administered 2021-10-13: 2.5 [IU]/h via INTRAVENOUS
  Filled 2021-10-13: qty 500

## 2021-10-13 MED ORDER — LACTATED RINGERS IV SOLN: 500.0000 mL | INTRAVENOUS | Status: DC | PRN

## 2021-10-13 MED ORDER — ONDANSETRON HCL 4 MG PO TABS: 4.0000 mg | ORAL_TABLET | ORAL | Status: DC | PRN

## 2021-10-13 MED ORDER — OXYCODONE-ACETAMINOPHEN 5-325 MG PO TABS: 2.0000 | ORAL_TABLET | ORAL | Status: DC | PRN

## 2021-10-13 MED ORDER — DIBUCAINE (PERIANAL) 1 % EX OINT
1.0000 | TOPICAL_OINTMENT | CUTANEOUS | Status: DC | PRN
  Administered 2021-10-13: 1 via RECTAL
  Filled 2021-10-13: qty 28

## 2021-10-13 MED ORDER — OXYTOCIN 10 UNIT/ML IJ SOLN
INTRAMUSCULAR | Status: AC
  Filled 2021-10-13: qty 2

## 2021-10-13 MED ORDER — TETANUS-DIPHTH-ACELL PERTUSSIS 5-2.5-18.5 LF-MCG/0.5 IM SUSY
0.5000 mL | PREFILLED_SYRINGE | Freq: Once | INTRAMUSCULAR | Status: AC
  Administered 2021-10-14: 0.5 mL via INTRAMUSCULAR
  Filled 2021-10-13 (×2): qty 0.5

## 2021-10-13 MED ORDER — BENZOCAINE-MENTHOL 20-0.5 % EX AERO
1.0000 | INHALATION_SPRAY | CUTANEOUS | Status: DC | PRN
  Administered 2021-10-13: 1 via TOPICAL
  Filled 2021-10-13: qty 56

## 2021-10-13 MED ORDER — DIPHENHYDRAMINE HCL 25 MG PO CAPS: 25.0000 mg | ORAL_CAPSULE | Freq: Four times a day (QID) | ORAL | Status: DC | PRN

## 2021-10-13 MED ORDER — IBUPROFEN 600 MG PO TABS
ORAL_TABLET | ORAL | Status: AC
  Administered 2021-10-13: 600 mg via ORAL
  Filled 2021-10-13: qty 1

## 2021-10-13 MED ORDER — DOCUSATE SODIUM 100 MG PO CAPS
100.0000 mg | ORAL_CAPSULE | Freq: Two times a day (BID) | ORAL | Status: DC
  Administered 2021-10-14 – 2021-10-15 (×3): 100 mg via ORAL
  Filled 2021-10-13 (×3): qty 1

## 2021-10-13 MED ORDER — BETAMETHASONE SOD PHOS & ACET 6 (3-3) MG/ML IJ SUSP
12.0000 mg | Freq: Once | INTRAMUSCULAR | Status: AC
  Administered 2021-10-13: 12 mg via INTRAMUSCULAR
  Filled 2021-10-13: qty 5

## 2021-10-13 MED ORDER — SOD CITRATE-CITRIC ACID 500-334 MG/5ML PO SOLN: 30.0000 mL | ORAL | Status: DC | PRN

## 2021-10-13 MED ORDER — SODIUM CHLORIDE 0.9 % IV SOLN: 1.0000 g | INTRAVENOUS | Status: DC

## 2021-10-13 MED ORDER — LIDOCAINE HCL (PF) 1 % IJ SOLN
30.0000 mL | INTRAMUSCULAR | Status: DC | PRN
  Filled 2021-10-13: qty 30

## 2021-10-13 MED ORDER — COCONUT OIL OIL: 1.0000 | TOPICAL_OIL | Status: DC | PRN

## 2021-10-13 MED ORDER — OXYTOCIN BOLUS FROM INFUSION
333.0000 mL | Freq: Once | INTRAVENOUS | Status: AC
Start: 2021-10-13 — End: 2021-10-13
  Administered 2021-10-13: 333 mL via INTRAVENOUS

## 2021-10-13 MED ORDER — MISOPROSTOL 200 MCG PO TABS
ORAL_TABLET | ORAL | Status: DC
Start: 2021-10-13 — End: 2021-10-13
  Filled 2021-10-13: qty 3

## 2021-10-13 MED ORDER — LIDOCAINE HCL (PF) 1 % IJ SOLN
INTRAMUSCULAR | Status: DC
Start: 2021-10-13 — End: 2021-10-13
  Filled 2021-10-13: qty 30

## 2021-10-13 MED ORDER — OXYCODONE-ACETAMINOPHEN 5-325 MG PO TABS: 1.0000 | ORAL_TABLET | ORAL | Status: DC | PRN

## 2021-10-13 MED ORDER — ONDANSETRON HCL 4 MG/2ML IJ SOLN: 4.0000 mg | Freq: Four times a day (QID) | INTRAMUSCULAR | Status: DC | PRN

## 2021-10-13 MED ORDER — ZOLPIDEM TARTRATE 5 MG PO TABS: 5.0000 mg | ORAL_TABLET | Freq: Every evening | ORAL | Status: DC | PRN

## 2021-10-13 MED ORDER — IBUPROFEN 600 MG PO TABS
600.0000 mg | ORAL_TABLET | Freq: Four times a day (QID) | ORAL | Status: DC
  Administered 2021-10-14 – 2021-10-15 (×6): 600 mg via ORAL
  Filled 2021-10-13 (×6): qty 1

## 2021-10-13 MED ORDER — ACETAMINOPHEN 325 MG PO TABS: 650.0000 mg | ORAL_TABLET | ORAL | Status: DC | PRN

## 2021-10-13 MED ORDER — SODIUM CHLORIDE 0.9 % IV SOLN: 2.0000 g | Freq: Once | INTRAVENOUS | Status: DC

## 2021-10-13 MED ORDER — AMMONIA AROMATIC IN INHA
RESPIRATORY_TRACT | Status: AC
  Filled 2021-10-13: qty 10

## 2021-10-13 MED ORDER — PRENATAL MULTIVITAMIN CH
1.0000 | ORAL_TABLET | Freq: Every day | ORAL | Status: DC
  Administered 2021-10-14 – 2021-10-15 (×2): 1 via ORAL
  Filled 2021-10-13 (×2): qty 1

## 2021-10-13 MED ORDER — LACTATED RINGERS IV SOLN: INTRAVENOUS | Status: DC

## 2021-10-13 MED ORDER — ONDANSETRON HCL 4 MG/2ML IJ SOLN: 4.0000 mg | INTRAMUSCULAR | Status: DC | PRN

## 2021-10-13 MED ORDER — SIMETHICONE 80 MG PO CHEW: 80.0000 mg | CHEWABLE_TABLET | ORAL | Status: DC | PRN

## 2021-10-13 MED ORDER — WITCH HAZEL-GLYCERIN EX PADS
1.0000 | MEDICATED_PAD | CUTANEOUS | Status: DC | PRN
  Administered 2021-10-13: 1 via TOPICAL
  Filled 2021-10-13: qty 100

## 2021-10-13 MED ORDER — ACETAMINOPHEN 325 MG PO TABS
650.0000 mg | ORAL_TABLET | ORAL | Status: DC | PRN
  Administered 2021-10-13: 650 mg via ORAL
  Filled 2021-10-13: qty 2

## 2021-10-13 NOTE — Discharge Summary (Signed)
Postpartum Discharge Summary  Date of Service updated 10/15/2021     Patient Name: Charlene Hood DOB: 03/23/1995 MRN: [id number]  Date of admission: 10/13/2021 Delivery date:10/13/2021  Delivering provider: Imagene Riches  Date of discharge: 10/15/2021  Admitting diagnosis: Vaginal discharge in pregnancy in third trimester [O26.893, N89.8] Preterm premature rupture of membranes in third trimester [O42.913] Intrauterine pregnancy: [redacted]w[redacted]d    Secondary diagnosis:  Active Problems:   Preterm labor in third trimester   Preterm premature rupture of membranes in third trimester   Postpartum care following vaginal delivery   Encounter for care and examination of lactating mother   [redacted] weeks gestation of pregnancy  Additional problems: none    Discharge diagnosis: Preterm Pregnancy Delivered                                              Post partum procedures: none Augmentation: N/A Complications: None  Hospital course: Onset of Labor With Vaginal Delivery      26y.o. yo [id number] admitted in Latent Labor on 10/13/2021. Patient had an uncomplicated labor course as follows:  Membrane Rupture Time/Date: 11:00 AM ,10/13/2021   Delivery Method:Vaginal, Spontaneous  Episiotomy: None  Lacerations:    Patient had an uncomplicated postpartum course.  She is tolerating regular diet, her pain is controlled with PO medication, she is ambulating and voiding without difficulty. She reports some pain with newborn latch and is currently pumping. Patient is discharged home in stable condition on 10/15/21.  Newborn Data: Birth date:10/13/2021  Birth time:5:53 PM  Gender:Female  Living status:Living  Apgars:8 ,9  Weight:2640 g   Magnesium Sulfate received: No BMZ received: Yes one dose Rhophylac:N/A MMR:No T-DaP:Given prenatally Flu: No Transfusion:No  Physical exam  Vitals:   10/14/21 0757 10/14/21 1608 10/14/21 2318 10/15/21 0759  BP: 113/78 107/70 122/81 123/81  Pulse:  71 60 (!) 53 (!) 51  Resp: 20 18 18 18   Temp: 98.4 F (36.9 C) 98.7 F (37.1 C) 98.2 F (36.8 C) 98.2 F (36.8 C)  TempSrc: Oral Axillary Oral Oral  SpO2: 99%  97% 100%  Weight:      Height:       General: alert, cooperative, and no distress Lochia: appropriate Uterine Fundus: firm Incision: N/A DVT Evaluation: No evidence of DVT seen on physical exam. Labs: Lab Results  Component Value Date   WBC 22.7 (H) 10/14/2021   HGB 10.2 (L) 10/14/2021   HCT 30.1 (L) 10/14/2021   MCV 92.3 10/14/2021   PLT 297 10/14/2021      Latest Ref Rng & Units 02/18/2016    3:11 PM  CMP  Glucose 65 - 99 mg/dL 120   BUN 6 - 20 mg/dL 13   Creatinine 0.44 - 1.00 mg/dL 0.58   Sodium 135 - 145 mmol/L 138   Potassium 3.5 - 5.1 mmol/L 3.6   Chloride 101 - 111 mmol/L 106   CO2 22 - 32 mmol/L 25   Calcium 8.9 - 10.3 mg/dL 8.8   Total Protein 6.5 - 8.1 g/dL 7.0   Total Bilirubin 0.3 - 1.2 mg/dL 0.9   Alkaline Phos 38 - 126 U/L 50   AST 15 - 41 U/L 30   ALT 14 - 54 U/L 17    Edinburgh Score:    10/14/2021    8:00 PM  EdFlavia Shipperostnatal  Depression Scale Screening Tool  I have been able to laugh and see the funny side of things. 0  I have looked forward with enjoyment to things. 0  I have blamed myself unnecessarily when things went wrong. 0  I have been anxious or worried for no good reason. 0  I have felt scared or panicky for no good reason. 0  Things have been getting on top of me. 0  I have been so unhappy that I have had difficulty sleeping. 0  I have felt sad or miserable. 0  I have been so unhappy that I have been crying. 0  The thought of harming myself has occurred to me. 0  Edinburgh Postnatal Depression Scale Total 0      After visit meds:  Allergies as of 10/15/2021   No Known Allergies      Medication List     TAKE these medications    ferrous sulfate 325 (65 FE) MG tablet Take 1 tablet (325 mg total) by mouth daily with breakfast.   multivitamin-prenatal 27-0.8  MG Tabs tablet Take 1 tablet by mouth daily at 12 noon.         Discharge home in stable condition Infant Feeding: Breast Infant Disposition: pending bilirubin level Discharge instruction: per After Visit Summary and Postpartum booklet. Activity: Advance as tolerated. Pelvic rest for 6 weeks.  Diet: routine diet Anticipated Birth Control: Nexplanon Postpartum Appointment:6 weeks Additional Postpartum F/U:  none Future Appointments: No future appointments.  Follow up Visit:  [facility] Follow up in 6 week(s).   Why: Please call the Health Department and make an appointment for a 6 week post delivery physical and to discuss birth control- Nexplanon Contact information: [place] [id number]                 10/15/2021 Rod Can, CNM

## 2021-10-13 NOTE — H&P (Signed)
Charlene Hood is a 26 y.o. female who presented to Labor and Delvkery with c/o vaginal discharge that she suspected was SROM. She is 34 weeks 6 days gestation per second trimester ultrasound. She reports noticing some leaking of fluid around 1100 this morning, and also started feeling regular cramping.  Her prenatal course is marked by being late to care, and having insufficient prenatal visits, and  ongoing tobacco use. A ROM plus test has come back positive for PPROM. She was also checked in triage and found to be 3 cms dilated/ 70% effaced/-3 station. After a 500 cc bolus of IV Lactated Ringers, her recheck  this hour indicates she is now dilated to 4cms/90%/-01 station. OB History     Gravida  2   Para  1   Term  1   Preterm  0   AB  0   Living  1      SAB  0   IAB  0   Ectopic  0   Multiple  0   Live Births  1          Past Medical History:  Diagnosis Date   Anemia    Headache    Patient denies medical problems    Past Surgical History:  Procedure Laterality Date   NO PAST SURGERIES     Removal of 2 back lesions (benign)     age 74 or 9 years   Family History: family history includes Alcohol abuse in her father and mother; Anemia in her mother; Cancer in her maternal grandmother; Cirrhosis in her father; Diabetes in her father; Healthy in her brother and half-brother; Heart attack in her maternal grandfather; Seizures in her half-brother. Social History:  reports that she has been smoking cigarettes. She has a 2.00 pack-year smoking history. She has been exposed to tobacco smoke. She has never used smokeless tobacco. She reports current alcohol use of about 5.0 standard drinks of alcohol per week. She reports that she does not currently use drugs after having used the following drugs: Marijuana.     Maternal Diabetes: No Genetic Screening: Declined Maternal Ultrasounds/Referrals: Normal Fetal Ultrasounds or other Referrals:  None Maternal Substance Abuse:   No Significant Maternal Medications:  None Significant Maternal Lab Results:  Other: awaiting the GBS rapid test. Number of Prenatal Visits:greater than 3 verified prenatal visits Other Comments:   late to care, insufficient prnatal care  Review of Systems  Constitutional: Negative.   HENT: Negative.    Eyes: Negative.   Respiratory: Negative.    Cardiovascular: Negative.   Gastrointestinal: Negative.   Endocrine: Negative.   Genitourinary: Negative.   Musculoskeletal: Negative.   Skin: Negative.   Allergic/Immunologic: Negative.   Neurological: Negative.   Hematological: Negative.   Psychiatric/Behavioral: Negative.     History Dilation: 4 Effacement (%): 90 Station: -2 Exam by:: Charlene Hood CNM Blood pressure 124/69, pulse 88, temperature 99.7 F (37.6 C), temperature source Oral, resp. rate 15, height 5' (1.524 m), weight 57.2 kg, last menstrual period 01/18/2021. Maternal Exam:  Uterine Assessment: Contraction strength is mild.  Contraction frequency is regular.  Abdomen: Fetal presentation: vertex Introitus: Normal vulva. Normal vagina.  Amniotic fluid character: clear. Pelvis: adequate for delivery.   Cervix: Cervix evaluated by digital exam.     Physical Exam Genitourinary:    General: Normal vulva.     Rectum: Normal.     Comments: Scant clear fluid noted. Initial SVE: 3/70/-3. Repeated at 1730- 4cms/90%/-1, OP Musculoskeletal:  General: Normal range of motion.  Skin:    General: Skin is warm and dry.  Neurological:     General: No focal deficit present.     Mental Status: She is oriented to person, place, and time.  Psychiatric:        Mood and Affect: Mood normal.        Behavior: Behavior normal.     Prenatal labs: ABO, Rh: A/Positive/-- (03/30 1530) Antibody: Negative (03/30 1530) Rubella:   RPR: Non Reactive (08/01 1404)  HBsAg: Negative (03/30 1530)  HIV:   Pending GBS:   pending  Assessment/Plan: IUP 34 weeks 6 days PPROM-  clear fluid Latent phase labor Reactive FHTS, Category 1  Admitted for labor Neonatology consult ordered, Discussed with the Nursery and the MD EFM continuous Unknown GBS status. Will hang Ampicillin 2 gm due to her labor status for unknown GBS status. Betamethazone has been given IM for one dose. Anesthesia for epidural per the patient's request. Anticipate SVD. Will have NBN team for delivery.  Charlene Hood is aware and agrees with the POC.  Charlene Hood, CNM  10/13/2021 5:42 PM     Charlene Hood Charlene Hood 10/13/2021, 5:25 PM

## 2021-10-13 NOTE — Telephone Encounter (Signed)
Return call to patient regarding her S/Sx.  Patient is 34w 6d pregnant today.  Patient reports starting with some cramping around 9 am this morning with some pinkish discharge and feeling of peeing on herself with no control to stop it.  She does have a little pressure feeling also.  She does report about 2 weeks ago she started with the feeling of having to poop, but it is only diarrhea. Consult with Florentina Addison , the maternity clinic coordinator and she wanted patient to go on the the ED at [facility] to be evaluated due to  patient having 6 cramps within an hour and also the feeling of peeing on herself with no control to stop it could represent her water breaking or has broken?  Will route phone note to the [facility] provider for review and sign.  Patient will plan to keep tomorrow's MH RV if she is not admitted (10-14-21 2 pm).  Hart Carwin, RN

## 2021-10-13 NOTE — OB Triage Note (Signed)
Patient is a G2P1 at [redacted]w[redacted]d who presents to unit c/o pinkish discharge and cramping that started today around 1000. She also reports pressure that comes and goes. Reports +FM, denies sexual intercourse in the past 24 hours. Initial FHT 170. External monitors applied and assessing. Vital signs WDL.

## 2021-10-13 NOTE — Telephone Encounter (Signed)
Pt is concerned and would like to speak to a nurse.  She is having a pinkish discharge and incontinence.

## 2021-10-14 ENCOUNTER — Ambulatory Visit: Payer: Medicaid Other

## 2021-10-14 LAB — CBC
HCT: 30.1 % — ABNORMAL LOW (ref 36.0–46.0)
Hemoglobin: 10.2 g/dL — ABNORMAL LOW (ref 12.0–15.0)
MCH: 31.3 pg (ref 26.0–34.0)
MCHC: 33.9 g/dL (ref 30.0–36.0)
MCV: 92.3 fL (ref 80.0–100.0)
Platelets: 297 10*3/uL (ref 150–400)
RBC: 3.26 MIL/uL — ABNORMAL LOW (ref 3.87–5.11)
RDW: 13 % (ref 11.5–15.5)
WBC: 22.7 10*3/uL — ABNORMAL HIGH (ref 4.0–10.5)
nRBC: 0 % (ref 0.0–0.2)

## 2021-10-14 LAB — RPR: RPR Ser Ql: NONREACTIVE

## 2021-10-14 NOTE — Lactation Note (Signed)
This note was copied from a baby's chart. Lactation Consultation Note  Patient Name: Charlene Hood Date: 10/14/2021 Reason for consult: Initial assessment;Late-preterm 34-36.6wks Age:25 hours, G2P2, 34 and 6 weeker at delivery  Lactation to the room for initial visit. Mother and FOB sitting in bed. Baby is asleep in bassinet.  Encouraged feeding on demand and with cues. If baby is not cueing encouraged hand expression and skin to skin, the offer bottle and pump-. Mother stated she pumped but it did not work so she has not been pumping. LC reviewed due to baby's gestational age and formula supplement we encourage pumping even if she sees no volume.  Encouraged 8 or more attempts in the first 24 hours and 8 or more good feeds after 24 HOL. Reviewed appropriate diapers for days of life and How to know your baby is getting enough to eat. Reviewed "Understanding Postpartum and Newborn Care" booklet at bedside. [facility] # left on board, encouraged to call for any assistance. Mother has no further questions at this time.   Maternal Data Has patient been taught Hand Expression?: Yes Does the patient have breastfeeding experience prior to this delivery?: Yes How long did the patient breastfeed?: 1-2 months  Feeding Mother's Current Feeding Choice: Breast Milk and Formula Nipple Type: Slow - flow  Interventions Interventions: Breast feeding basics reviewed;DEBP;Hand pump;Education  Discharge Discharge Education: Engorgement and breast care;Warning signs for feeding baby Pump: DEBP;Manual (Symphony at bedside) [facility] Program: Yes  Consult Status Consult Status: PRN  Gabriela Irigoyen D Anaja Monts 10/14/2021, 9:57 AM

## 2021-10-14 NOTE — Progress Notes (Signed)
Progress Note - Vaginal Delivery  Charlene Hood is a 26 y.o. G2P1102 now PP day 1 s/p Vaginal, Spontaneous .   Subjective:  The patient reports no complaints, up ad lib, voiding, tolerating PO, and + flatus   Objective:  Vital signs in last 24 hours: Temp:  [97.7 F (36.5 C)-99.7 F (37.6 C)] 98.4 F (36.9 C) (08/29 0757) Pulse Rate:  [56-93] 71 (08/29 0757) Resp:  [15-20] 20 (08/29 0757) BP: (108-125)/(58-81) 113/78 (08/29 0757) SpO2:  [96 %-99 %] 99 % (08/29 0757) Weight:  [57.2 kg] 57.2 kg (08/28 1400)  Physical Exam:  General: alert, cooperative, and appears stated age 73: appropriate Uterine Fundus: firm    Data Review Recent Labs    10/13/21 1741 10/14/21 0525  HGB 10.9* 10.2*  HCT 31.6* 30.1*    Assessment/Plan: Principal Problem:   Vaginal discharge in pregnancy in third trimester Active Problems:   Preterm labor in third trimester   Preterm premature rupture of membranes in third trimester   Postpartum care following vaginal delivery   Plan for discharge tomorrow Breast and bottle feeding Plans nexplanon for BC  -- Continue routine PP care.     Doreene Burke, CNM  10/14/2021 8:54 AM

## 2021-10-15 DIAGNOSIS — Z3A34 34 weeks gestation of pregnancy: Secondary | ICD-10-CM

## 2021-10-15 LAB — RUBELLA ANTIBODY, IGM: Rubella IgM: <20 AU/mL (ref 0.0–19.9)

## 2021-10-15 NOTE — Progress Notes (Signed)
Discharge instructions reviewed with pt. Pt will leave out and father will stay and support baby for this evening.  Patient discharged home but plans to room in with baby for tonight.  Discharge instructions, when to follow up, and prescriptions reviewed with patient.  Patient verbalized understanding. Patient will be escorted out by auxiliary.

## 2021-10-16 ENCOUNTER — Ambulatory Visit: Payer: Self-pay

## 2021-10-16 NOTE — Lactation Note (Addendum)
This note was copied from a baby's chart. Lactation Consultation Note  Patient Name: Charlene Hood [id number] Date: 10/16/2021 Reason for consult: Follow-up assessment;Late-preterm 34-36.6wks;Breastfeeding assistance;Other (Comment);RN request (Pumping plan, mother with sore nipples.) Age:25 hours  Maternal Data  This is mom's 2nd baby, NSVD. Mom with history of anemia. Mom reports she attempted to breastfeed her first child but baby had latching issues, she experienced nipple pain, and pumping she also found painful. Mom's goal is to breastfeed and bottle feed this baby preferably with her milk and she will also use formula as needed. Mom reports she since yesterday has not offered baby to breastfeed as she was concerned with the baby needing the bili treatment so she just wanted to focus on feeding the bottle. Per parents baby has been taking the bottle consistently. Mom reports the baby does latch and at times it is painful. She also reports using the breastpump has been painful.  Has patient been taught Hand Expression?: Yes Does the patient have breastfeeding experience prior to this delivery?: Yes How long did the patient breastfeed?: 4 months (Mom reports she tried  breastfeeding for 4 months.)  Feeding Mother's Current Feeding Choice: Breast Milk and Formula  Baby noted to be rooting after NP examined him. Assisted mom with positioning baby at the right breast in mom's preferred position of the football hold. Assisted mom maximize her latch and position techniques. Baby did latch fairly well. Mom encouraged to support the breast to help baby maintain a good latch. Baby after latching fell asleep. Per parents baby had just completed a bottle feeding from 11:30-12 noon. A second attempt was made to relatch baby with pursed lips would not latch.  LATCH Score Latch: Too sleepy or reluctant, no latch achieved, no sucking elicited. (Mom reports baby completed a feed a short time ago from  11:30-12 noon. Baby was rooting after being examined so mom offered baby to breastfeed.)  Audible Swallowing: None  Type of Nipple: Everted at rest and after stimulation  Comfort (Breast/Nipple): Filling, red/small blisters or bruises, mild/mod discomfort  Hold (Positioning): Assistance needed to correctly position infant at breast and maintain latch.  LATCH Score: 4   Lactation Tools Discussed/Used Tools: Comfort gels;Other (comment) (DEBP) Breast pump type: Double-Electric Breast Pump Pump Education: Setup, frequency, and cleaning;Milk Storage Reason for Pumping: LPT baby on feeding plan Pumping frequency: Mom instructed to pump as often as the baby eats to establish her milk supply allowing one 4-5 hour stretch for sleep at night. Goal is 7- 8 pump sessions in /24 hours. Pumped volume: 39 mL  Assisted mom with maximizing use of the pump to increase milk production and mom's comfort. Provided mom with pure-lan to small scab on tip of nipple for moist wound healing.  Interventions  Breastfeeding basics reviewed, assisted with latch,hand express, breast massage,breast compression,adjust pillow, support pillows, education.  Reviewed late preterm feeding plan:  -Parents understand to offer baby to eat at least 8 times/24 hours, about every 3 hours. -Mom can offer baby to breastfeed for now 2-3 times/24 hours at times when baby appears alert and awake. Mom will time limit breastfeeding for 10-15 minutes if baby is actively swallowing and eating and after breastfeeding mom will offer baby expressed breastmilk and/or formula by bottle.If baby is falling asleep and sucking non-nutritively mom will stop the BF attempt and offer baby expressed milk or formula supplement. -Parents will offer 30 ml's at each feeding following babes cues for satiety.  -If baby refuses 2 feedings  in a row or it has been 6 hours parents understand to call the baby's pediatrician once they go home and the care nurse  while they are in the hospital. -Parents understand to keep a record of baby's feeding, wet, and dirty diapers and take to the first pediatric check-up. -Mom will pump as often as the baby eats and store milk according to storage milk guidelines reviewed today. -Mom will wash parts and pieces as demonstrated today.  Feeding plan discussed collaboratively with NP and parents. Parents are in agreement with plan.   Discharge Discharge Education: Engorgement and breast care;Warning signs for feeding baby;Other (comment) (Mom reports baby will follow-up at [facility]. Mom understands she can call [facility] lactation for assistance once she goes home if she needs additional lactation assistance.) Pump: Manual (Mom provided manual pump with breast pump kit. Mom reports she knows how to use the manual pump. Mom understands fax sent to [facility] to request an electric pump as baby is LPT with a feeding plan.) WIC Program: Yes  Consult Status Consult Status: PRN Date: 10/16/21 Follow-up type: In-patient  Update provided to care nurse.  Jonna Clark 10/16/2021, 2:17 PM

## 2021-10-27 NOTE — Addendum Note (Signed)
Addended by: Heywood Bene on: 10/27/2021 11:31 AM   Modules accepted: Orders

## 2021-11-03 ENCOUNTER — Telehealth: Payer: Self-pay | Admitting: Family Medicine

## 2021-11-03 NOTE — Telephone Encounter (Signed)
LVM for patient to call us to schedule her Post Partum Appointment.

## 2021-11-03 NOTE — Telephone Encounter (Signed)
Called patient earlier and LVM. Received a voicemail on my phone later from the number on file for this patient. It was someone stating that I had dialed the wrong number. I checked and verified that the number I initially called is the same number that called back and left the voicemail. Then I called the patients brother and left a voicemail asking him to have her call us.

## 2023-11-27 IMAGING — US US OB COMP +14 WK
1 series · 15 of 28 positions shown · non-contrast
Comparison: none

CLINICAL DATA: Pregnant, assess gestational age

EXAM:
OBSTETRICAL ULTRASOUND >14 WKS

[Series 1: us ob comp + 14 wk · 15 of 100 slices shown]
[im 1/100]
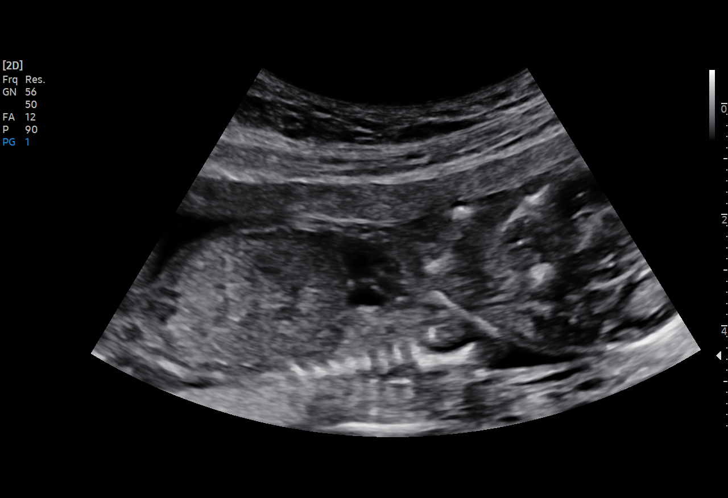
[im 8/100]
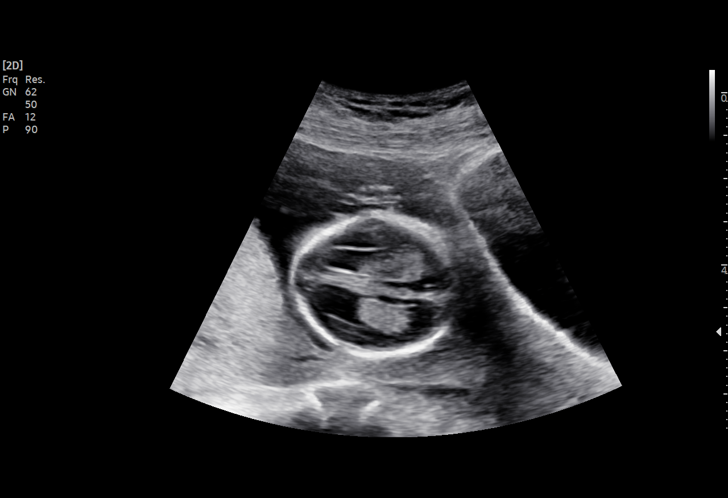
[im 15/100]
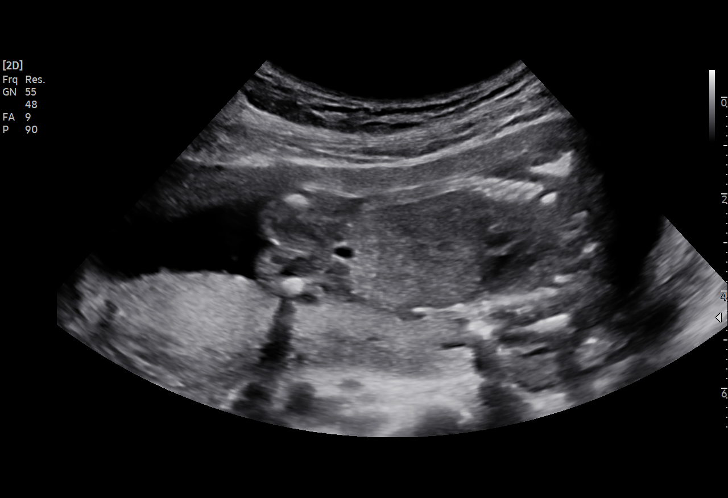
[im 23/100]
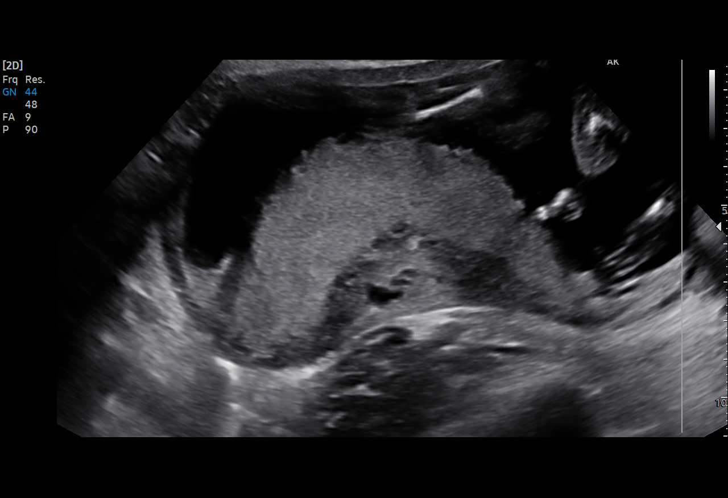
[im 30/100]
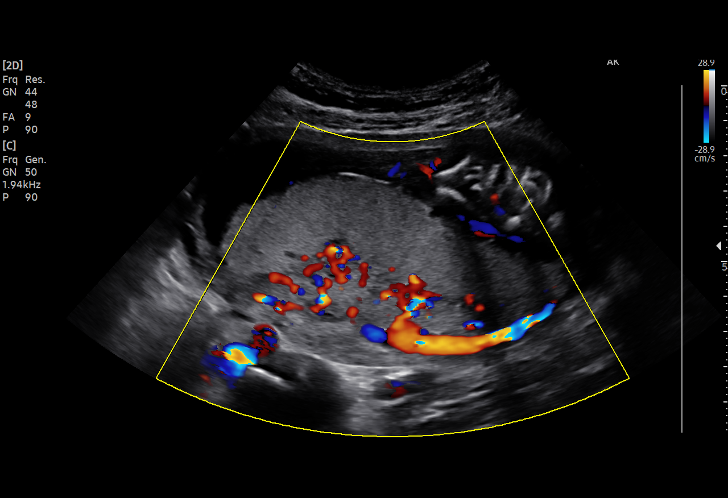
[im 37/100]
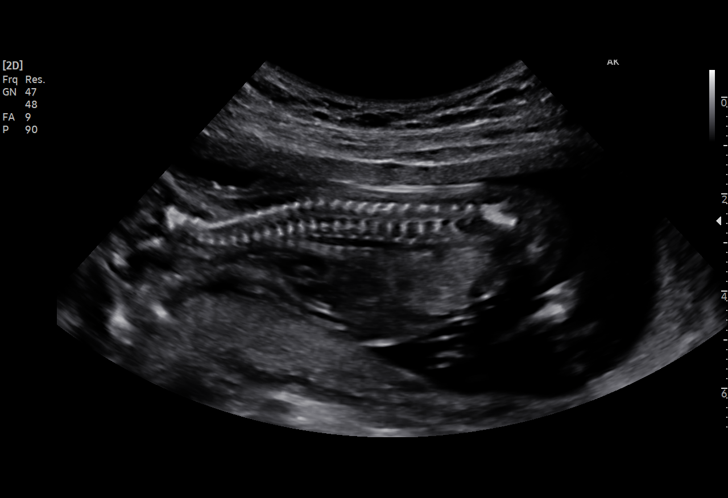
[im 45/100]
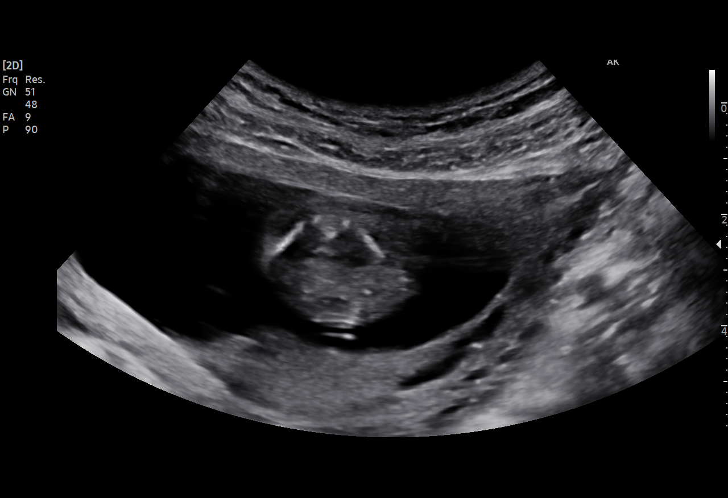
[im 52/100]
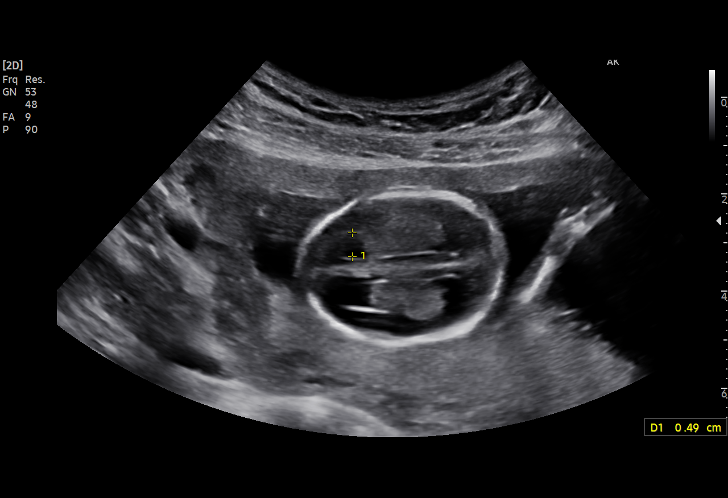
[im 56/100]
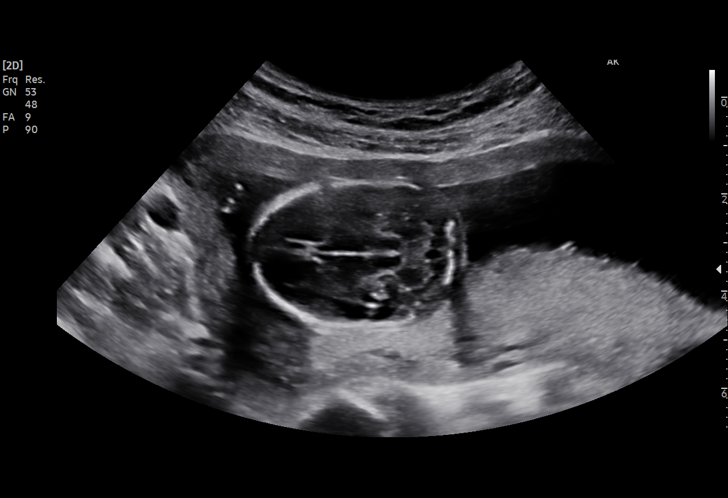
[im 63/100]
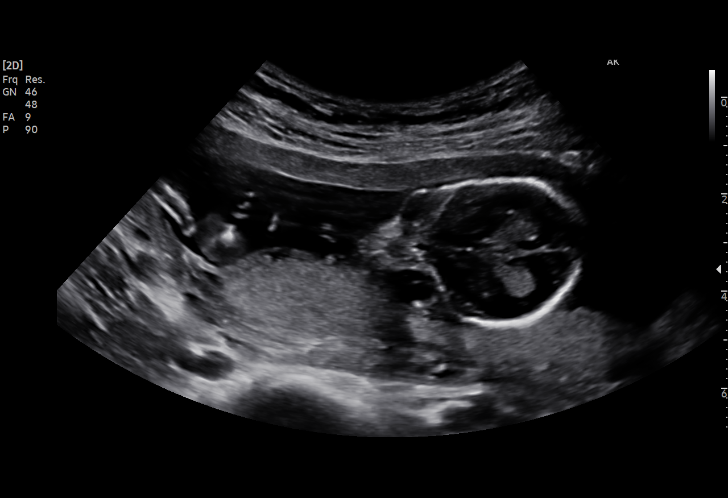
[im 70/100]
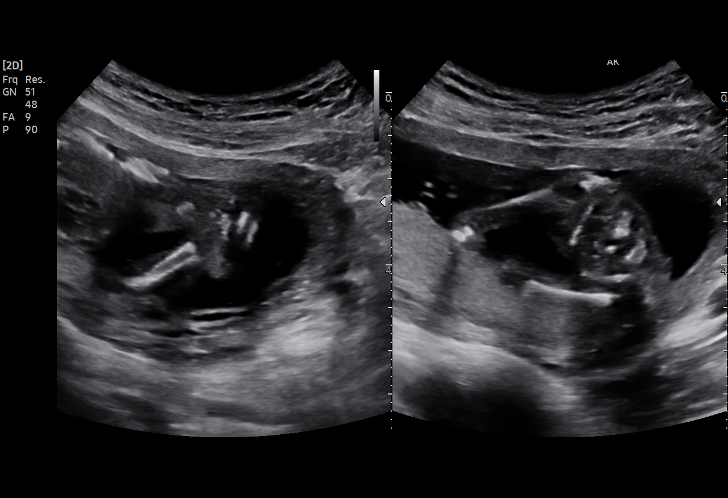
[im 78/100]
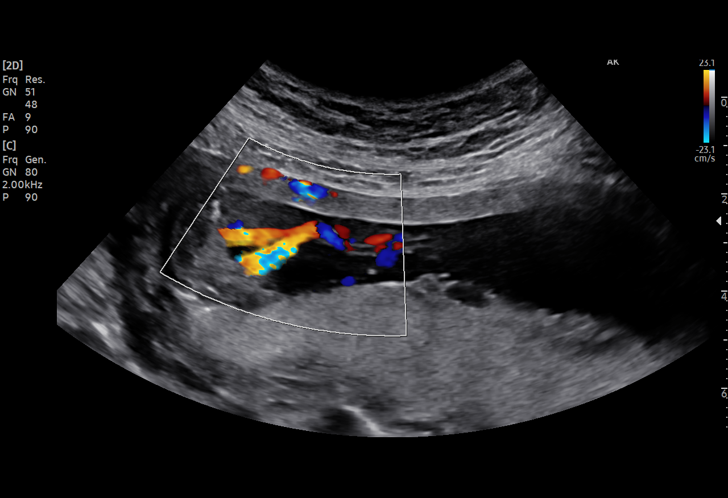
[im 85/100]
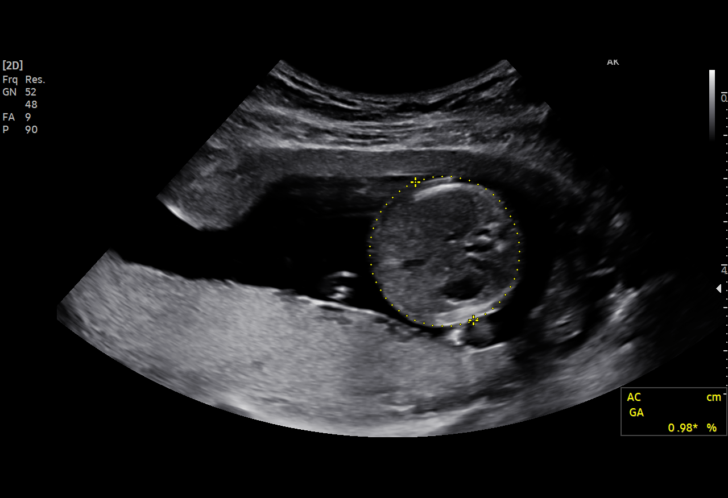
[im 92/100]
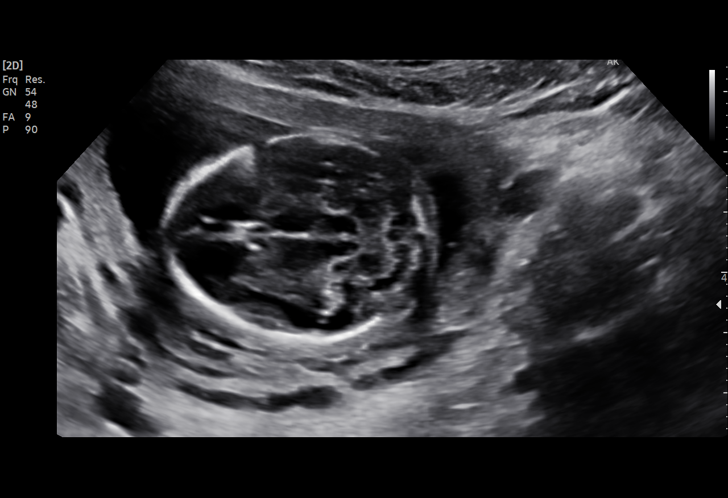
[im 100/100]
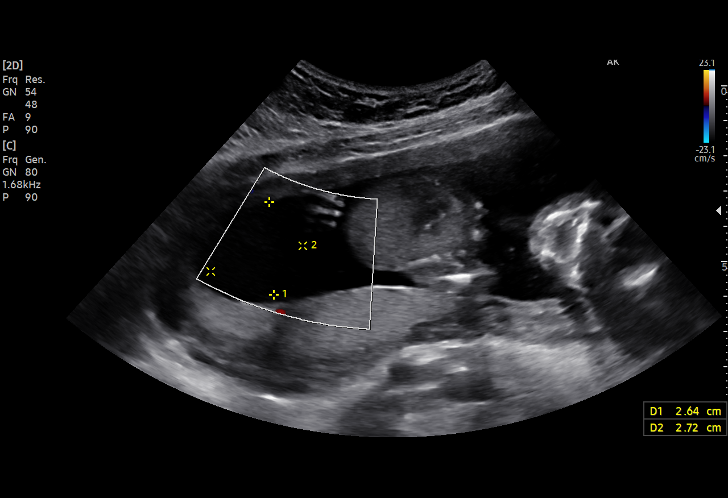

[15 of 28 positions shown; findings below may reference images not displayed]

FINDINGS: Number of Fetuses: 1

Heart Rate:  136 bpm

Movement: Yes

Presentation: Variable

Previa: No

Placental Location: Posterior

Amniotic Fluid (Subjective): Normal

Amniotic Fluid (Objective):

Vertical pocket = 2.6cm

FETAL BIOMETRY

BPD: 3.0cm 15w 4d

HC:   12.3cm 16w 1d

AC:   10.8cm 16w 5d

FL:   1.9cm 15w 4d

Current Mean GA: 15w 6d US EDC: 11/18/2021

Assigned GA:  19w 2d Assigned EDC: 10/25/2021

FETAL ANATOMY

Lateral Ventricles: Appears normal

Thalami/CSP: Not well visualized

Posterior Fossa:  Appears normal

Nuchal Region: Appears normal   NFT= 1.9 mm

Upper Lip: Appears normal

Spine: Appears normal

4 Chamber Heart on Left: Not visualized

LVOT: Not visualized

RVOT: Appears normal

Stomach on Left: Appears normal

3 Vessel Cord: Appears normal

Cord Insertion site: Appears normal

Kidneys: Appears normal

Bladder: Appears normal

Extremities: Appears normal

Sex: Male

Technically difficult due to: Early gestational age

Maternal Findings:

Cervix:  3.5 cm
IMPRESSION: 1. Single live intrauterine pregnancy as above, estimated age 15
weeks and 6 days.
2. Suboptimal visualization of the cavum septum pellucidum,
four-chamber heart, and LVOT. The remainder of the fetal anatomic
survey was unremarkable. Follow-up exam at 20 weeks gestation may be
useful.

## 2024-01-25 IMAGING — US US MFM OB COMP +14 WKS
1 series · 13 of 28 positions shown · non-contrast
Comparison: none

[Series 1: us mfm ob comp +14 wks · 116 acquisitions, 13 frames shown]
[im 5/116]
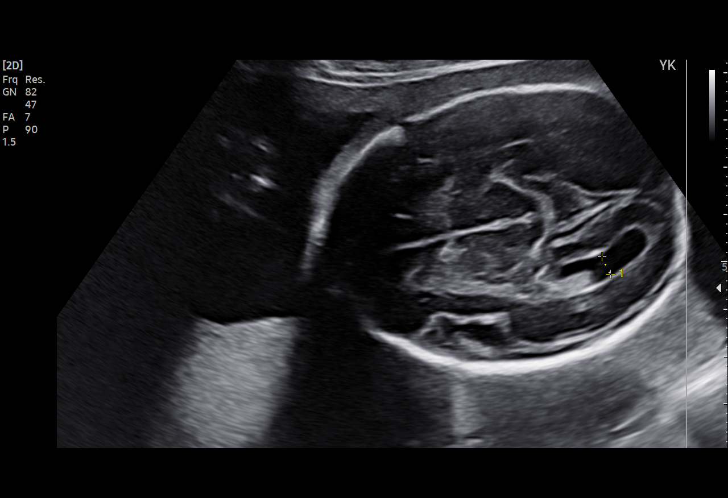
[im 13/116]
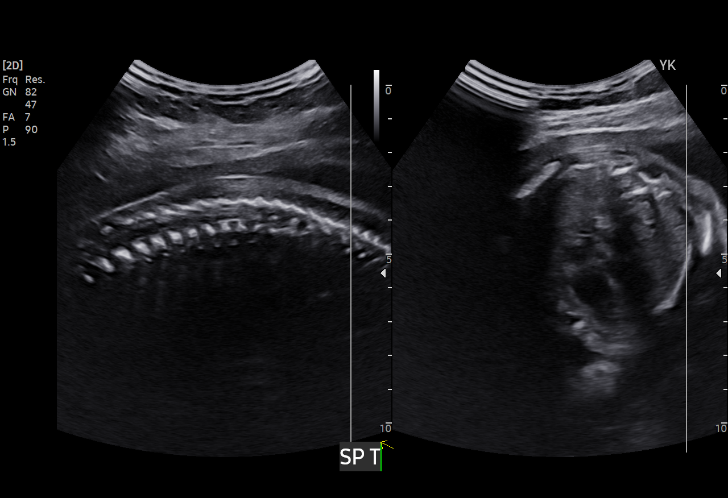
[im 22/116]
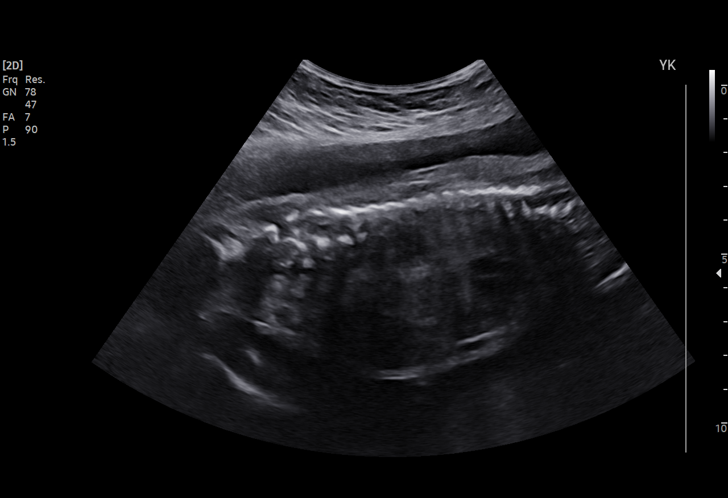
[im 30/116]
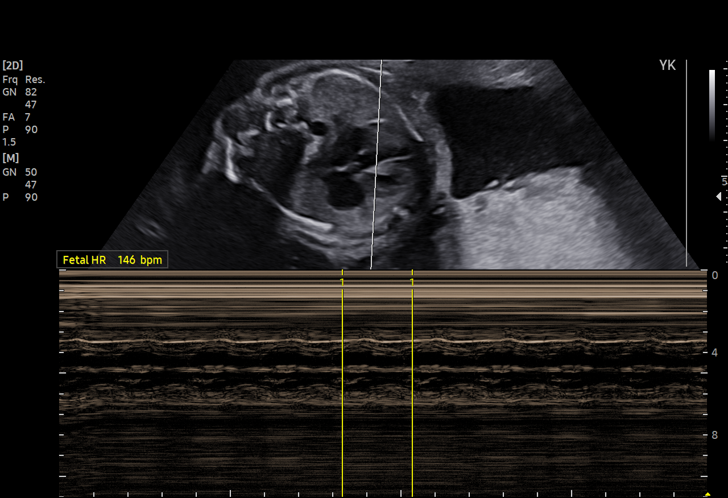
[im 39/116]
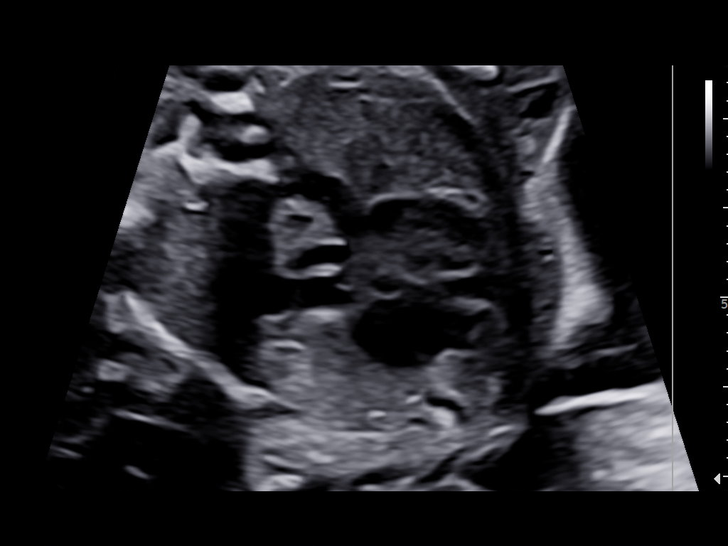
[im 47/116]
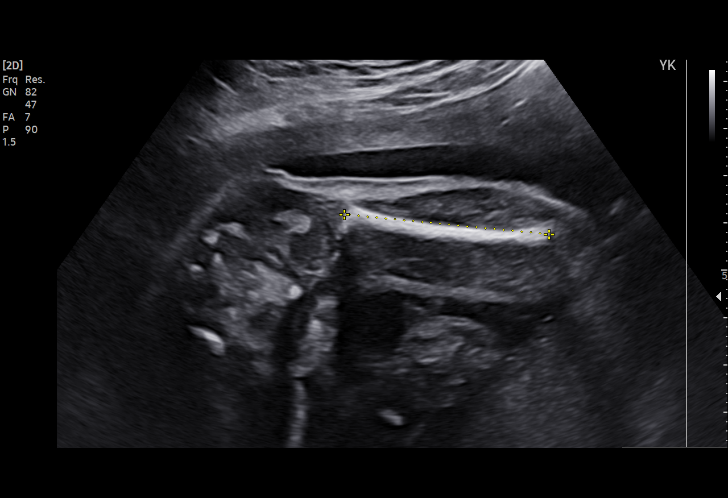
[im 60/116]
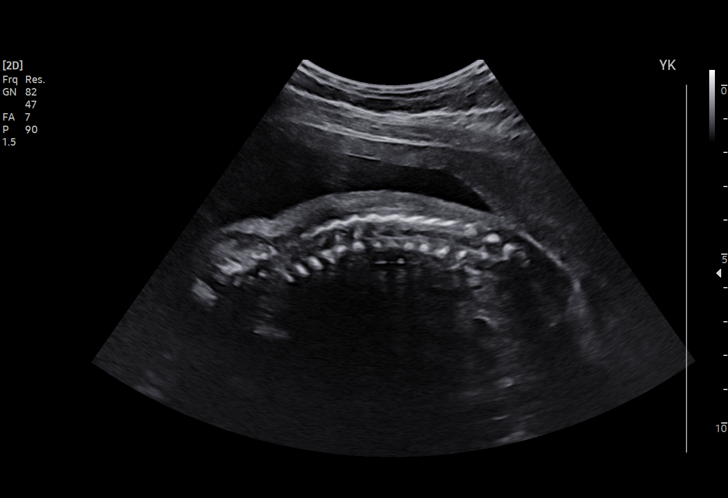
[im 69/116]
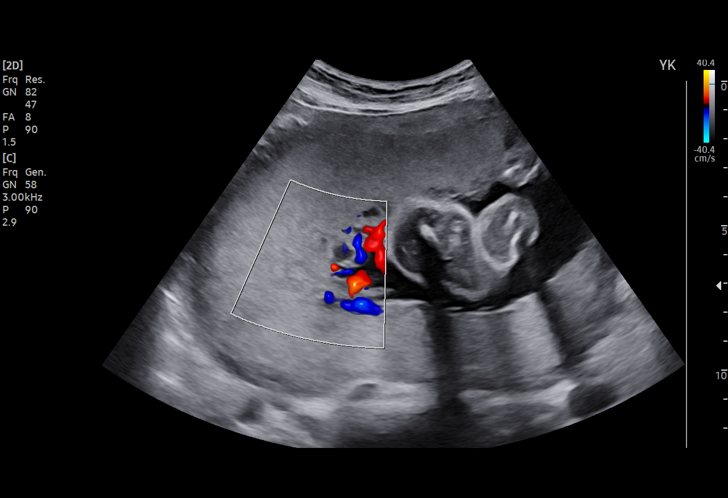
[im 77/116]
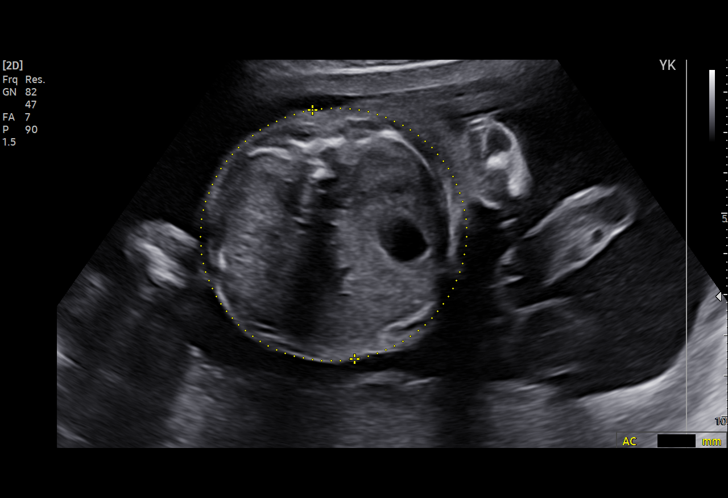
[im 86/116]
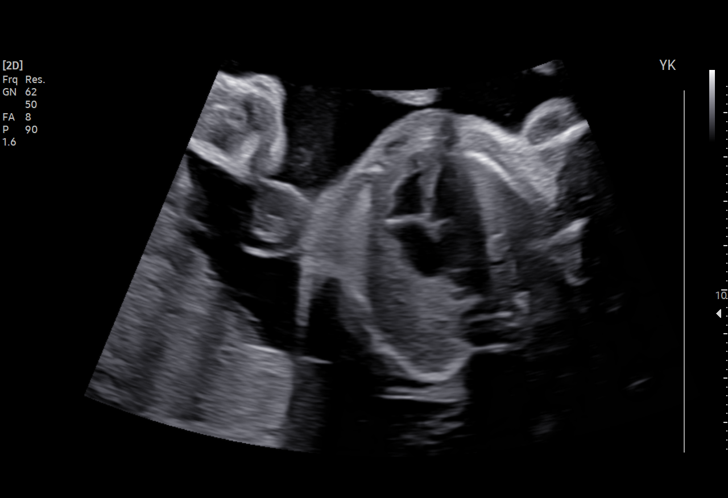
[im 94/116]
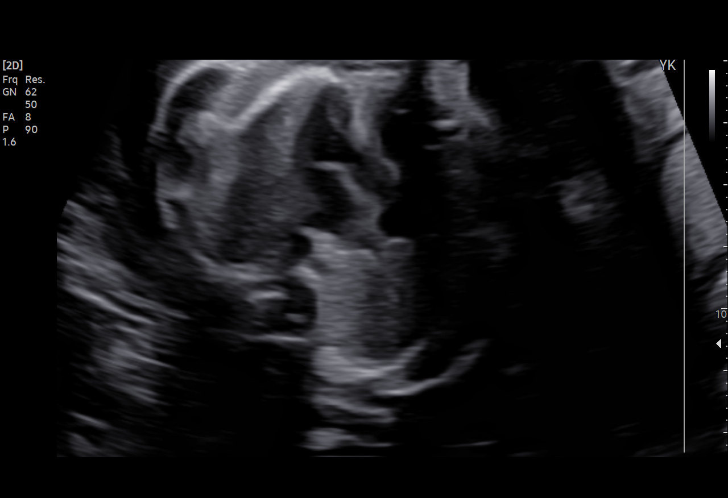
[im 103/116]
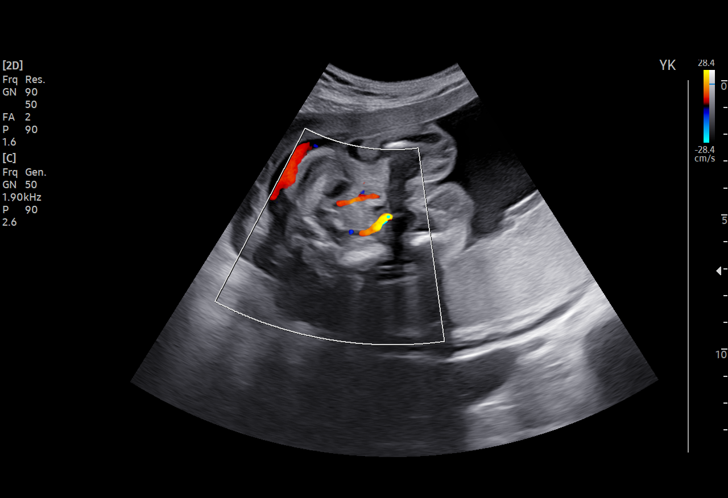
[im 111/116]
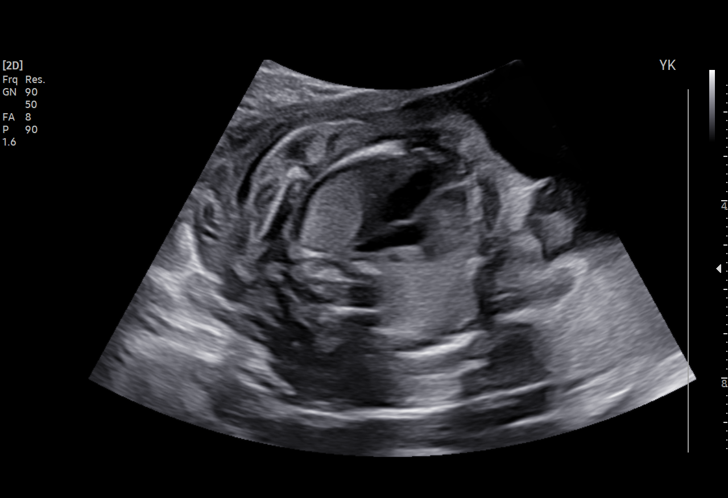

[13 of 28 positions shown; findings below may reference images not displayed]

KOLLINS CNM

 1  US MFM OB COMP + 14 WK                76805.01    VRUNDA SISAY

Indications

 Fetal abnormality - other known or suspected
 Encounter for antenatal screening for
 malformations
 Quad screen declined.
 24 weeks gestation of pregnancy
Fetal Evaluation

 Num Of Fetuses:         1
 Fetal Heart Rate(bpm):  108
 Cardiac Activity:       Observed
 Presentation:           Cephalic
 Placenta:               Posterior Fundal
 P. Cord Insertion:      Visualized, central

 Amniotic Fluid
 AFI FV:      Within normal limits

                             Largest Pocket(cm)

Biometry

 BPD:     59.58  mm     G. Age:  24w 2d         44  %    CI:        72.62   %    70 - 86
                                                         FL/HC:      19.2   %    18.7 -
 HC:    222.35   mm     G. Age:  24w 2d         30  %    HC/AC:      1.12        1.05 -
 AC:    198.96   mm     G. Age:  24w 4d         49  %    FL/BPD:     71.6   %    71 - 87
 FL:      42.66  mm     G. Age:  24w 0d         26  %    FL/AC:      21.4   %    20 - 24

 Est. FW:     680  gm      1 lb 8 oz     41  %
OB History

 Gravidity:    2         Term:   1
 Living:       1
Gestational Age

 LMP:           24w 2d        Date:  02/11/21                 EDD:   11/18/21
 U/S Today:     24w 2d                                        EDD:   11/18/21
 Best:          24w 2d     Det. By:  LMP  (02/11/21)          EDD:   11/18/21
Anatomy

 Cranium:               Appears normal         Aortic Arch:            Appears normal
 Cavum:                 Appears normal         Ductal Arch:            Appears normal
 Ventricles:            Appears normal         Diaphragm:              Appears normal
 Choroid Plexus:        Appears normal         Stomach:                Appears normal, left
                                                                       sided
 Cerebellum:            Appears normal         Abdomen:                Appears normal
 Posterior Fossa:       Appears normal         Abdominal Wall:         Appears nml (cord
                                                                       insert, abd wall)
 Nuchal Fold:           Not applicable (>20    Cord Vessels:           Appears normal (3
                        wks GA)                                        vessel cord)
 Face:                  Appears normal         Kidneys:                Appear normal
                        (orbits and profile)
 Lips:                  Appears normal         Bladder:                Appears normal
 Thoracic:              Appears normal         Spine:                  Appears normal
 Heart:                 Appears normal         Upper Extremities:      Appears normal
                        (4CH, axis, and
                        situs)
 RVOT:                  Appears normal         Lower Extremities:      Appears normal
 LVOT:                  Appears normal

 Other:  Hands and feet visualized. Fetus appears to be a male. VC, 3VV and
         3VTV visualized.
Cervix Uterus Adnexa

 Cervix
 Length:            4.3  cm.
 Normal appearance by transabdominal scan.

 Uterus
 No abnormality visualized.

 Right Ovary
 Within normal limits.

 Left Ovary
 Within normal limits.

 Cul De Sac
 No free fluid seen.

 Adnexa
 No abnormality visualized.
Comments
 This patient was seen for a detailed fetal anatomy scan.
 She denies any significant past medical history and denies
 any problems in her current pregnancy.
 She has declined all screening tests for fetal aneuploidy in
 her current pregnancy.
 She was informed that the fetal growth and amniotic fluid
 level were appropriate for her gestational age.
 There were no obvious fetal anomalies noted on today's
 ultrasound exam.
 The patient was informed that anomalies may be missed due
 to technical limitations. If the fetus is in a suboptimal position
 or maternal habitus is increased, visualization of the fetus in
 the maternal uterus may be impaired.
 She was offered and declined a screening test for fetal
 aneuploidy today.
 Follow up as indicated.
